# Patient Record
Sex: Female | Born: 1970 | Race: Black or African American | Hispanic: No | Smoking: Never smoker
Health system: Southern US, Community
[De-identification: ages and names within clinical notes are randomized; demographics above are authoritative.]

## PROBLEM LIST (undated history)

## (undated) DIAGNOSIS — M1990 Unspecified osteoarthritis, unspecified site: Secondary | ICD-10-CM

## (undated) DIAGNOSIS — N200 Calculus of kidney: Secondary | ICD-10-CM

## (undated) DIAGNOSIS — G500 Trigeminal neuralgia: Secondary | ICD-10-CM

## (undated) DIAGNOSIS — E0500 Thyrotoxicosis with diffuse goiter without thyrotoxic crisis or storm: Secondary | ICD-10-CM

## (undated) DIAGNOSIS — E119 Type 2 diabetes mellitus without complications: Secondary | ICD-10-CM

## (undated) DIAGNOSIS — I10 Essential (primary) hypertension: Secondary | ICD-10-CM

## (undated) HISTORY — PX: BREAST LUMPECTOMY: SHX2

## (undated) HISTORY — PX: TONSILLECTOMY: SUR1361

## (undated) HISTORY — PX: ABLATION: SHX5711

## (undated) HISTORY — PX: LITHOTRIPSY: SUR834

## (undated) HISTORY — PX: TUBAL LIGATION: SHX77

---

## 2008-03-05 ENCOUNTER — Ambulatory Visit: Payer: Self-pay | Admitting: Radiology

## 2008-03-05 ENCOUNTER — Emergency Department (HOSPITAL_BASED_OUTPATIENT_CLINIC_OR_DEPARTMENT_OTHER): Admission: EM | Admit: 2008-03-05 | Discharge: 2008-03-05 | Payer: Self-pay | Admitting: Emergency Medicine

## 2008-03-30 ENCOUNTER — Emergency Department (HOSPITAL_BASED_OUTPATIENT_CLINIC_OR_DEPARTMENT_OTHER): Admission: EM | Admit: 2008-03-30 | Discharge: 2008-03-30 | Payer: Self-pay | Admitting: Emergency Medicine

## 2008-03-31 ENCOUNTER — Emergency Department (HOSPITAL_BASED_OUTPATIENT_CLINIC_OR_DEPARTMENT_OTHER): Admission: EM | Admit: 2008-03-31 | Discharge: 2008-04-01 | Payer: Self-pay | Admitting: Emergency Medicine

## 2008-04-04 ENCOUNTER — Emergency Department (HOSPITAL_BASED_OUTPATIENT_CLINIC_OR_DEPARTMENT_OTHER): Admission: EM | Admit: 2008-04-04 | Discharge: 2008-04-04 | Payer: Self-pay | Admitting: Emergency Medicine

## 2009-02-21 ENCOUNTER — Emergency Department (HOSPITAL_BASED_OUTPATIENT_CLINIC_OR_DEPARTMENT_OTHER): Admission: EM | Admit: 2009-02-21 | Discharge: 2009-02-21 | Payer: Self-pay | Admitting: Emergency Medicine

## 2009-04-19 ENCOUNTER — Emergency Department (HOSPITAL_BASED_OUTPATIENT_CLINIC_OR_DEPARTMENT_OTHER): Admission: EM | Admit: 2009-04-19 | Discharge: 2009-04-19 | Payer: Self-pay | Admitting: Emergency Medicine

## 2009-12-10 ENCOUNTER — Emergency Department (HOSPITAL_BASED_OUTPATIENT_CLINIC_OR_DEPARTMENT_OTHER): Admission: EM | Admit: 2009-12-10 | Discharge: 2009-12-10 | Payer: Self-pay | Admitting: Emergency Medicine

## 2010-04-08 ENCOUNTER — Emergency Department (HOSPITAL_BASED_OUTPATIENT_CLINIC_OR_DEPARTMENT_OTHER)
Admission: EM | Admit: 2010-04-08 | Discharge: 2010-04-08 | Payer: Self-pay | Source: Home / Self Care | Admitting: Emergency Medicine

## 2010-06-09 LAB — URINALYSIS, ROUTINE W REFLEX MICROSCOPIC
Bilirubin Urine: NEGATIVE
Ketones, ur: NEGATIVE mg/dL
Nitrite: NEGATIVE
Protein, ur: NEGATIVE mg/dL
Urobilinogen, UA: 1 mg/dL (ref 0.0–1.0)
pH: 6 (ref 5.0–8.0)

## 2010-06-09 LAB — PREGNANCY, URINE: Preg Test, Ur: NEGATIVE

## 2010-07-22 ENCOUNTER — Emergency Department (HOSPITAL_BASED_OUTPATIENT_CLINIC_OR_DEPARTMENT_OTHER)
Admission: EM | Admit: 2010-07-22 | Discharge: 2010-07-22 | Disposition: A | Discharge status: Home / Self Care | Payer: BC Managed Care – PPO | Attending: Emergency Medicine | Admitting: Emergency Medicine

## 2010-07-22 ENCOUNTER — Emergency Department (INDEPENDENT_AMBULATORY_CARE_PROVIDER_SITE_OTHER): Payer: BC Managed Care – PPO

## 2010-07-22 DIAGNOSIS — M25579 Pain in unspecified ankle and joints of unspecified foot: Secondary | ICD-10-CM | POA: Insufficient documentation

## 2010-07-22 DIAGNOSIS — M79609 Pain in unspecified limb: Secondary | ICD-10-CM

## 2010-07-22 DIAGNOSIS — IMO0001 Reserved for inherently not codable concepts without codable children: Secondary | ICD-10-CM | POA: Insufficient documentation

## 2010-07-22 DIAGNOSIS — I10 Essential (primary) hypertension: Secondary | ICD-10-CM | POA: Insufficient documentation

## 2010-07-22 DIAGNOSIS — J45909 Unspecified asthma, uncomplicated: Secondary | ICD-10-CM | POA: Insufficient documentation

## 2011-04-05 ENCOUNTER — Encounter (HOSPITAL_BASED_OUTPATIENT_CLINIC_OR_DEPARTMENT_OTHER): Payer: Self-pay | Admitting: *Deleted

## 2011-04-05 ENCOUNTER — Emergency Department (HOSPITAL_BASED_OUTPATIENT_CLINIC_OR_DEPARTMENT_OTHER)
Admission: EM | Admit: 2011-04-05 | Discharge: 2011-04-06 | Disposition: A | Discharge status: Home / Self Care | Payer: BC Managed Care – PPO | Attending: Emergency Medicine | Admitting: Emergency Medicine

## 2011-04-05 ENCOUNTER — Emergency Department (INDEPENDENT_AMBULATORY_CARE_PROVIDER_SITE_OTHER): Payer: BC Managed Care – PPO

## 2011-04-05 DIAGNOSIS — I10 Essential (primary) hypertension: Secondary | ICD-10-CM | POA: Insufficient documentation

## 2011-04-05 DIAGNOSIS — Z09 Encounter for follow-up examination after completed treatment for conditions other than malignant neoplasm: Secondary | ICD-10-CM | POA: Insufficient documentation

## 2011-04-05 DIAGNOSIS — Z79899 Other long term (current) drug therapy: Secondary | ICD-10-CM | POA: Insufficient documentation

## 2011-04-05 DIAGNOSIS — T148XXA Other injury of unspecified body region, initial encounter: Secondary | ICD-10-CM

## 2011-04-05 DIAGNOSIS — W3400XA Accidental discharge from unspecified firearms or gun, initial encounter: Secondary | ICD-10-CM

## 2011-04-05 DIAGNOSIS — Z8739 Personal history of other diseases of the musculoskeletal system and connective tissue: Secondary | ICD-10-CM | POA: Insufficient documentation

## 2011-04-05 DIAGNOSIS — J45909 Unspecified asthma, uncomplicated: Secondary | ICD-10-CM | POA: Insufficient documentation

## 2011-04-05 DIAGNOSIS — IMO0002 Reserved for concepts with insufficient information to code with codable children: Secondary | ICD-10-CM

## 2011-04-05 HISTORY — DX: Thyrotoxicosis with diffuse goiter without thyrotoxic crisis or storm: E05.00

## 2011-04-05 HISTORY — DX: Essential (primary) hypertension: I10

## 2011-04-05 HISTORY — DX: Unspecified osteoarthritis, unspecified site: M19.90
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
## 2012-08-14 ENCOUNTER — Encounter (HOSPITAL_BASED_OUTPATIENT_CLINIC_OR_DEPARTMENT_OTHER): Payer: Self-pay

## 2012-08-14 ENCOUNTER — Emergency Department (HOSPITAL_BASED_OUTPATIENT_CLINIC_OR_DEPARTMENT_OTHER): Payer: 59

## 2012-08-14 ENCOUNTER — Emergency Department (HOSPITAL_BASED_OUTPATIENT_CLINIC_OR_DEPARTMENT_OTHER)
Admission: EM | Admit: 2012-08-14 | Discharge: 2012-08-14 | Disposition: A | Discharge status: Home / Self Care | Payer: 59 | Attending: Emergency Medicine | Admitting: Emergency Medicine

## 2012-08-14 DIAGNOSIS — Z8739 Personal history of other diseases of the musculoskeletal system and connective tissue: Secondary | ICD-10-CM | POA: Insufficient documentation

## 2012-08-14 DIAGNOSIS — Z79899 Other long term (current) drug therapy: Secondary | ICD-10-CM | POA: Insufficient documentation

## 2012-08-14 DIAGNOSIS — Z8639 Personal history of other endocrine, nutritional and metabolic disease: Secondary | ICD-10-CM | POA: Insufficient documentation

## 2012-08-14 DIAGNOSIS — M79645 Pain in left finger(s): Secondary | ICD-10-CM

## 2012-08-14 DIAGNOSIS — E119 Type 2 diabetes mellitus without complications: Secondary | ICD-10-CM | POA: Insufficient documentation

## 2012-08-14 DIAGNOSIS — Z8669 Personal history of other diseases of the nervous system and sense organs: Secondary | ICD-10-CM | POA: Insufficient documentation

## 2012-08-14 DIAGNOSIS — J45909 Unspecified asthma, uncomplicated: Secondary | ICD-10-CM | POA: Insufficient documentation

## 2012-08-14 DIAGNOSIS — Z862 Personal history of diseases of the blood and blood-forming organs and certain disorders involving the immune mechanism: Secondary | ICD-10-CM | POA: Insufficient documentation

## 2012-08-14 DIAGNOSIS — IMO0002 Reserved for concepts with insufficient information to code with codable children: Secondary | ICD-10-CM | POA: Insufficient documentation

## 2012-08-14 DIAGNOSIS — R220 Localized swelling, mass and lump, head: Secondary | ICD-10-CM | POA: Insufficient documentation

## 2012-08-14 DIAGNOSIS — R222 Localized swelling, mass and lump, trunk: Secondary | ICD-10-CM | POA: Insufficient documentation

## 2012-08-14 DIAGNOSIS — I10 Essential (primary) hypertension: Secondary | ICD-10-CM | POA: Insufficient documentation

## 2012-08-14 DIAGNOSIS — R221 Localized swelling, mass and lump, neck: Secondary | ICD-10-CM | POA: Insufficient documentation

## 2012-08-14 DIAGNOSIS — Z87442 Personal history of urinary calculi: Secondary | ICD-10-CM | POA: Insufficient documentation

## 2012-08-14 DIAGNOSIS — M79609 Pain in unspecified limb: Secondary | ICD-10-CM | POA: Insufficient documentation

## 2012-08-14 HISTORY — DX: Type 2 diabetes mellitus without complications: E11.9

## 2012-08-14 HISTORY — DX: Calculus of kidney: N20.0

## 2012-08-14 HISTORY — DX: Trigeminal neuralgia: G50.0

## 2012-08-14 NOTE — ED Notes (Signed)
C/o"burning and stinging" to left hand-started approx 7pm while sitting outside

## 2012-08-14 NOTE — ED Notes (Signed)
Pt ambulatory to bathroom without difficulty.  

## 2012-08-14 NOTE — ED Provider Notes (Signed)
History     CSN: [id number]  Arrival date & time 08/14/12  2001   First MD Initiated Contact with Patient 08/14/12 2132      Chief Complaint  Patient presents with  . Hand Pain    (Consider location/radiation/quality/duration/timing/severity/associated sxs/prior treatment) Patient is a 42 y.o. female presenting with hand pain. The history is provided by the patient.  Hand Pain This is a new problem. The current episode started today. The problem occurs constantly. The problem has been gradually worsening. Nothing aggravates the symptoms. She has tried nothing for the symptoms. The treatment provided moderate relief.  Pt reports sudden onset of stinging pain to left hand and palmar aspect of left hand.   Pt reports she has a mass growing on her neck  And she is concerned pain is from this. Pt was referred for evaluation of mass but has been unable to go due to a sick family member  Past Medical History  Diagnosis Date  . Asthma   . Arthritis   . Grave's disease   . Hypertension   . Diabetes mellitus without complication   . Kidney stone   . Trigeminal neuralgia     Past Surgical History  Procedure Laterality Date  . Tubal ligation    . Tonsillectomy    . Lithotripsy      No family history on file.  History  Substance Use Topics  . Smoking status: Never Smoker   . Smokeless tobacco: Not on file  . Alcohol Use: No    OB History   Grav Para Term Preterm Abortions TAB SAB Ect Mult Living                  Review of Systems  All other systems reviewed and are negative.    Allergies  Ultram  Home Medications   Current Outpatient Rx  Name  Route  Sig  Dispense  Refill  . lisinopril-hydrochlorothiazide (PRINZIDE,ZESTORETIC) 10-12.5 MG per tablet   Oral   Take 1 tablet by mouth daily.         Marland Kitchen METFORMIN HCL PO   Oral   Take by mouth.         Marland Kitchen albuterol (PROVENTIL HFA;VENTOLIN HFA) 108 (90 BASE) MCG/ACT inhaler   Inhalation   Inhale 2 puffs into  the lungs every 6 (six) hours as needed. For shortness of breath and wheezing         . budesonide-formoterol (SYMBICORT) 160-4.5 MCG/ACT inhaler   Inhalation   Inhale 2 puffs into the lungs 2 (two) times daily.         . chlorpheniramine (CHLOR-TRIMETON) 4 MG tablet   Oral   Take 4 mg by mouth once.         . ferrous sulfate 325 (65 FE) MG EC tablet   Oral   Take 975 mg by mouth 3 (three) times daily with meals.         Marland Kitchen ibuprofen (ADVIL,MOTRIN) 200 MG tablet   Oral   Take 400 mg by mouth every 6 (six) hours as needed. For pain         . Multiple Vitamin (MULITIVITAMIN WITH MINERALS) TABS   Oral   Take 1 tablet by mouth daily.         . vitamin B-12 (CYANOCOBALAMIN) 1000 MCG tablet   Oral   Take 1,000 mcg by mouth daily.         . vitamin C (ASCORBIC ACID) 500 MG tablet   Oral  Take 500 mg by mouth daily.           BP 155/97  Pulse 84  Temp(Src) 98.2 F (36.8 C) (Oral)  Resp 20  Ht 5\' 6"  (1.676 m)  Wt 235 lb (106.595 kg)  BMI 37.95 kg/m2  SpO2 99%  LMP 08/10/2012  Physical Exam  Constitutional: She appears well-developed and well-nourished.  HENT:  Head: Normocephalic.  Cardiovascular: Normal rate.   Pulmonary/Chest: Effort normal.  Abdominal: Soft.  Musculoskeletal: Normal range of motion.  eccymosis base of left 3rd finger,   Pt appears to have a small ruptured vein,   Left neck,  Golf ball sized swollen area,  Soft,  Feels like a lypoma  Neurological: She is alert.  Skin: Skin is warm.    ED Course  Procedures (including critical care time)  Labs Reviewed - No data to display No results found.   1. Finger pain, left   2. Mass in chest       MDM          Elson Areas, PA-C 08/14/12 2234  Lonia Skinner De Leon, PA-C 08/14/12 2234

## 2012-08-15 NOTE — ED Provider Notes (Signed)
Medical screening examination/treatment/procedure(s) were performed by non-physician practitioner and as supervising physician I was immediately available for consultation/collaboration.   Audric Venn, MD 08/15/12 1856 

## 2013-03-12 ENCOUNTER — Emergency Department (HOSPITAL_BASED_OUTPATIENT_CLINIC_OR_DEPARTMENT_OTHER)
Admission: EM | Admit: 2013-03-12 | Discharge: 2013-03-12 | Disposition: A | Discharge status: Home / Self Care | Payer: 59 | Attending: Emergency Medicine | Admitting: Emergency Medicine

## 2013-03-12 ENCOUNTER — Encounter (HOSPITAL_BASED_OUTPATIENT_CLINIC_OR_DEPARTMENT_OTHER): Payer: Self-pay | Admitting: Emergency Medicine

## 2013-03-12 DIAGNOSIS — R110 Nausea: Secondary | ICD-10-CM

## 2013-03-12 DIAGNOSIS — E119 Type 2 diabetes mellitus without complications: Secondary | ICD-10-CM | POA: Insufficient documentation

## 2013-03-12 DIAGNOSIS — M129 Arthropathy, unspecified: Secondary | ICD-10-CM | POA: Insufficient documentation

## 2013-03-12 DIAGNOSIS — J45909 Unspecified asthma, uncomplicated: Secondary | ICD-10-CM | POA: Insufficient documentation

## 2013-03-12 DIAGNOSIS — I10 Essential (primary) hypertension: Secondary | ICD-10-CM | POA: Insufficient documentation

## 2013-03-12 DIAGNOSIS — Z3202 Encounter for pregnancy test, result negative: Secondary | ICD-10-CM | POA: Insufficient documentation

## 2013-03-12 DIAGNOSIS — R5383 Other fatigue: Secondary | ICD-10-CM | POA: Insufficient documentation

## 2013-03-12 DIAGNOSIS — R5381 Other malaise: Secondary | ICD-10-CM | POA: Insufficient documentation

## 2013-03-12 DIAGNOSIS — Z8669 Personal history of other diseases of the nervous system and sense organs: Secondary | ICD-10-CM | POA: Insufficient documentation

## 2013-03-12 DIAGNOSIS — Z87442 Personal history of urinary calculi: Secondary | ICD-10-CM | POA: Insufficient documentation

## 2013-03-12 DIAGNOSIS — Z79899 Other long term (current) drug therapy: Secondary | ICD-10-CM | POA: Insufficient documentation

## 2013-03-12 LAB — CBC WITH DIFFERENTIAL/PLATELET
Basophils Relative: 1 % (ref 0–1)
Eosinophils Absolute: 0.1 10*3/uL (ref 0.0–0.7)
Hemoglobin: 12 g/dL (ref 12.0–15.0)
Lymphs Abs: 3.3 10*3/uL (ref 0.7–4.0)
MCH: 27.3 pg (ref 26.0–34.0)
MCHC: 33.9 g/dL (ref 30.0–36.0)
Monocytes Relative: 6 % (ref 3–12)
Neutro Abs: 4.3 10*3/uL (ref 1.7–7.7)
Neutrophils Relative %: 53 % (ref 43–77)
Platelets: 274 10*3/uL (ref 150–400)
RBC: 4.39 MIL/uL (ref 3.87–5.11)

## 2013-03-12 LAB — COMPREHENSIVE METABOLIC PANEL
AST: 48 U/L — ABNORMAL HIGH (ref 0–37)
CO2: 25 mEq/L (ref 19–32)
Calcium: 9.7 mg/dL (ref 8.4–10.5)
Chloride: 101 mEq/L (ref 96–112)
Creatinine, Ser: 0.7 mg/dL (ref 0.50–1.10)
GFR calc Af Amer: >90 mL/min (ref >90)
GFR calc non Af Amer: >90 mL/min (ref >90)
Glucose, Bld: 163 mg/dL — ABNORMAL HIGH (ref 70–99)
Total Bilirubin: 0.5 mg/dL (ref 0.3–1.2)

## 2013-03-12 LAB — LIPASE, BLOOD: Lipase: 50 U/L (ref 11–59)

## 2013-03-12 LAB — URINALYSIS, ROUTINE W REFLEX MICROSCOPIC
Bilirubin Urine: NEGATIVE
Glucose, UA: NEGATIVE mg/dL
Ketones, ur: 15 mg/dL — AB
Protein, ur: NEGATIVE mg/dL
Urobilinogen, UA: 1 mg/dL (ref 0.0–1.0)

## 2013-03-12 LAB — URINE MICROSCOPIC-ADD ON

## 2013-03-12 MED ORDER — ONDANSETRON HCL 4 MG PO TABS: 4.0000 mg | ORAL_TABLET | Freq: Three times a day (TID) | ORAL | Status: DC | PRN

## 2013-03-12 MED ORDER — ONDANSETRON HCL 8 MG PO TABS
4.0000 mg | ORAL_TABLET | Freq: Once | ORAL | Status: AC
  Administered 2013-03-12: 4 mg via ORAL
  Filled 2013-03-12: qty 1

## 2013-03-12 NOTE — ED Notes (Addendum)
C/o ha onset yesterday  Nausea onset this am, vomiting x 1  Denies diarrhea,   Hot/  Cold flashes   Generalized body aches

## 2013-03-12 NOTE — ED Notes (Signed)
Pt sipping ginger ale.

## 2013-03-12 NOTE — ED Notes (Signed)
C/o right sided abdominal yesterday, onset nausea today.  One episode of vomiting.  Recently (02/25/13)started a new DM medication (Glyburide).  Denies diarrhea, fever.

## 2013-03-12 NOTE — ED Provider Notes (Signed)
Medical screening examination/treatment/procedure(s) were performed by non-physician practitioner and as supervising physician I was immediately available for consultation/collaboration.     Geoffery Lyons, MD 03/12/13 [id number]

## 2013-03-12 NOTE — ED Provider Notes (Signed)
CSN: [id number]     Arrival date & time 03/12/13  1927 History   First MD Initiated Contact with Patient 03/12/13 1948     Chief Complaint  Patient presents with  . Abdominal Pain   (Consider location/radiation/quality/duration/timing/severity/associated sxs/prior Treatment) Patient is a 42 y.o. female presenting with weakness. The history is provided by the patient. No language interpreter was used.  Weakness The problem has been gradually worsening. Associated symptoms include nausea and weakness. Pertinent negatives include no abdominal pain, chills, fever or vomiting. Associated symptoms comments: She reports nausea with vomiting and generalized aches that started yesterday. No fever. She denies congestion, sore throat, cough, pain or diarrhea. "I just feel sick". .    Past Medical History  Diagnosis Date  . Asthma   . Arthritis   . Grave's disease   . Hypertension   . Diabetes mellitus without complication   . Kidney stone   . Trigeminal neuralgia    Past Surgical History  Procedure Laterality Date  . Tubal ligation    . Tonsillectomy    . Lithotripsy     No family history on file. History  Substance Use Topics  . Smoking status: Never Smoker   . Smokeless tobacco: Not on file  . Alcohol Use: No   OB History   Grav Para Term Preterm Abortions TAB SAB Ect Mult Living                 Review of Systems  Constitutional: Negative for fever and chills.  HENT: Negative.   Respiratory: Negative.   Cardiovascular: Negative.   Gastrointestinal: Positive for nausea. Negative for vomiting, abdominal pain and diarrhea.  Genitourinary: Negative.  Negative for dysuria.  Musculoskeletal: Negative.   Skin: Negative.   Neurological: Positive for weakness.    Allergies  Ultram  Home Medications   Current Outpatient Rx  Name  Route  Sig  Dispense  Refill  . cholecalciferol (VITAMIN D) 1000 UNITS tablet   Oral   Take 2,000 Units by mouth daily.         Marland Kitchen glyBURIDE  (DIABETA) 1.25 MG tablet   Oral   Take 1.25 mg by mouth daily with breakfast.         . albuterol (PROVENTIL HFA;VENTOLIN HFA) 108 (90 BASE) MCG/ACT inhaler   Inhalation   Inhale 2 puffs into the lungs every 6 (six) hours as needed. For shortness of breath and wheezing         . budesonide-formoterol (SYMBICORT) 160-4.5 MCG/ACT inhaler   Inhalation   Inhale 2 puffs into the lungs 2 (two) times daily.         . chlorpheniramine (CHLOR-TRIMETON) 4 MG tablet   Oral   Take 4 mg by mouth once.         . ferrous sulfate 325 (65 FE) MG EC tablet   Oral   Take 975 mg by mouth 3 (three) times daily with meals.         Marland Kitchen ibuprofen (ADVIL,MOTRIN) 200 MG tablet   Oral   Take 400 mg by mouth every 6 (six) hours as needed. For pain         . lisinopril-hydrochlorothiazide (PRINZIDE,ZESTORETIC) 10-12.5 MG per tablet   Oral   Take 1 tablet by mouth daily.         Marland Kitchen METFORMIN HCL PO   Oral   Take by mouth.         . Multiple Vitamin (MULITIVITAMIN WITH MINERALS) TABS   Oral  Take 1 tablet by mouth daily.         . vitamin B-12 (CYANOCOBALAMIN) 1000 MCG tablet   Oral   Take 1,000 mcg by mouth daily.         . vitamin C (ASCORBIC ACID) 500 MG tablet   Oral   Take 500 mg by mouth daily.          BP 149/90  Pulse 88  Temp(Src) 98.2 F (36.8 C) (Oral)  Resp 20  Ht 5\' 5"  (1.651 m)  Wt 238 lb (107.956 kg)  BMI 39.61 kg/m2  SpO2 100%  LMP 03/11/2013 Physical Exam  Constitutional: She is oriented to person, place, and time. She appears well-developed and well-nourished.  HENT:  Head: Normocephalic.  Neck: Normal range of motion. Neck supple.  Cardiovascular: Normal rate and regular rhythm.   Pulmonary/Chest: Effort normal and breath sounds normal.  Abdominal: Soft. Bowel sounds are normal. There is no tenderness. There is no rebound and no guarding.  Musculoskeletal: Normal range of motion.  Neurological: She is alert and oriented to person, place, and  time.  Skin: Skin is warm and dry. No rash noted.  Psychiatric: She has a normal mood and affect.    ED Course  Procedures (including critical care time) Labs Review Labs Reviewed  URINALYSIS, ROUTINE W REFLEX MICROSCOPIC - Abnormal; Notable for the following:    Color, Urine AMBER (*)    APPearance CLOUDY (*)    Hgb urine dipstick LARGE (*)    Ketones, ur 15 (*)    Leukocytes, UA SMALL (*)    All other components within normal limits  COMPREHENSIVE METABOLIC PANEL - Abnormal; Notable for the following:    Glucose, Bld 163 (*)    AST 48 (*)    ALT 56 (*)    All other components within normal limits  CBC WITH DIFFERENTIAL - Abnormal; Notable for the following:    HCT 35.4 (*)    All other components within normal limits  URINE MICROSCOPIC-ADD ON - Abnormal; Notable for the following:    Squamous Epithelial / LPF FEW (*)    All other components within normal limits  PREGNANCY, URINE  LIPASE, BLOOD   Imaging Review No results found.  EKG Interpretation   None       MDM  No diagnosis found. 1. Nausea without vomiting 2. Malaise  She appears well, non-toxic. Tolerating PO fluids in ED. No vomiting, no fever. Stable for discharge with likely viral process.     Arnoldo Hooker, PA-C 03/12/13 2147

## 2013-03-12 NOTE — ED Notes (Signed)
Attempted to collect a UA, unable to void.

## 2013-04-08 ENCOUNTER — Encounter (HOSPITAL_BASED_OUTPATIENT_CLINIC_OR_DEPARTMENT_OTHER): Payer: Self-pay | Admitting: Emergency Medicine

## 2013-04-08 ENCOUNTER — Emergency Department (HOSPITAL_BASED_OUTPATIENT_CLINIC_OR_DEPARTMENT_OTHER): Payer: 59

## 2013-04-08 ENCOUNTER — Emergency Department (HOSPITAL_BASED_OUTPATIENT_CLINIC_OR_DEPARTMENT_OTHER)
Admission: EM | Admit: 2013-04-08 | Discharge: 2013-04-08 | Disposition: A | Discharge status: Home / Self Care | Payer: 59 | Attending: Emergency Medicine | Admitting: Emergency Medicine

## 2013-04-08 DIAGNOSIS — R6883 Chills (without fever): Secondary | ICD-10-CM | POA: Insufficient documentation

## 2013-04-08 DIAGNOSIS — M129 Arthropathy, unspecified: Secondary | ICD-10-CM | POA: Insufficient documentation

## 2013-04-08 DIAGNOSIS — E119 Type 2 diabetes mellitus without complications: Secondary | ICD-10-CM | POA: Insufficient documentation

## 2013-04-08 DIAGNOSIS — J45909 Unspecified asthma, uncomplicated: Secondary | ICD-10-CM | POA: Insufficient documentation

## 2013-04-08 DIAGNOSIS — R110 Nausea: Secondary | ICD-10-CM | POA: Insufficient documentation

## 2013-04-08 DIAGNOSIS — R5383 Other fatigue: Secondary | ICD-10-CM | POA: Insufficient documentation

## 2013-04-08 DIAGNOSIS — I10 Essential (primary) hypertension: Secondary | ICD-10-CM | POA: Insufficient documentation

## 2013-04-08 DIAGNOSIS — R5381 Other malaise: Secondary | ICD-10-CM | POA: Insufficient documentation

## 2013-04-08 DIAGNOSIS — B9789 Other viral agents as the cause of diseases classified elsewhere: Secondary | ICD-10-CM | POA: Insufficient documentation

## 2013-04-08 DIAGNOSIS — R1011 Right upper quadrant pain: Secondary | ICD-10-CM | POA: Insufficient documentation

## 2013-04-08 DIAGNOSIS — R197 Diarrhea, unspecified: Secondary | ICD-10-CM | POA: Insufficient documentation

## 2013-04-08 DIAGNOSIS — Z79899 Other long term (current) drug therapy: Secondary | ICD-10-CM | POA: Insufficient documentation

## 2013-04-08 DIAGNOSIS — J3489 Other specified disorders of nose and nasal sinuses: Secondary | ICD-10-CM | POA: Insufficient documentation

## 2013-04-08 DIAGNOSIS — Z8669 Personal history of other diseases of the nervous system and sense organs: Secondary | ICD-10-CM | POA: Insufficient documentation

## 2013-04-08 DIAGNOSIS — B349 Viral infection, unspecified: Secondary | ICD-10-CM

## 2013-04-08 DIAGNOSIS — Z87442 Personal history of urinary calculi: Secondary | ICD-10-CM | POA: Insufficient documentation

## 2013-04-08 LAB — COMPREHENSIVE METABOLIC PANEL
ALBUMIN: 4 g/dL (ref 3.5–5.2)
ALT: 61 U/L — AB (ref 0–35)
AST: 67 U/L — AB (ref 0–37)
Alkaline Phosphatase: 51 U/L (ref 39–117)
BUN: 9 mg/dL (ref 6–23)
CALCIUM: 9.6 mg/dL (ref 8.4–10.5)
CO2: 25 mEq/L (ref 19–32)
Chloride: 103 mEq/L (ref 96–112)
Creatinine, Ser: 0.7 mg/dL (ref 0.50–1.10)
GFR calc Af Amer: >90 mL/min (ref >90)
Glucose, Bld: 101 mg/dL — ABNORMAL HIGH (ref 70–99)
Potassium: 4.2 mEq/L (ref 3.7–5.3)
SODIUM: 140 meq/L (ref 137–147)
TOTAL PROTEIN: 7.6 g/dL (ref 6.0–8.3)
Total Bilirubin: 0.8 mg/dL (ref 0.3–1.2)

## 2013-04-08 LAB — CBC WITH DIFFERENTIAL/PLATELET
Basophils Absolute: 0 10*3/uL (ref 0.0–0.1)
Basophils Relative: 1 % (ref 0–1)
Eosinophils Absolute: 0.1 10*3/uL (ref 0.0–0.7)
Eosinophils Relative: 1 % (ref 0–5)
HCT: 37.1 % (ref 36.0–46.0)
HEMOGLOBIN: 12.2 g/dL (ref 12.0–15.0)
LYMPHS ABS: 2.6 10*3/uL (ref 0.7–4.0)
LYMPHS PCT: 40 % (ref 12–46)
MCH: 26.7 pg (ref 26.0–34.0)
MCHC: 32.9 g/dL (ref 30.0–36.0)
MCV: 81.2 fL (ref 78.0–100.0)
MONOS PCT: 7 % (ref 3–12)
Monocytes Absolute: 0.4 10*3/uL (ref 0.1–1.0)
NEUTROS ABS: 3.3 10*3/uL (ref 1.7–7.7)
NEUTROS PCT: 51 % (ref 43–77)
Platelets: 259 10*3/uL (ref 150–400)
RBC: 4.57 MIL/uL (ref 3.87–5.11)
RDW: 15.8 % — ABNORMAL HIGH (ref 11.5–15.5)
WBC: 6.5 10*3/uL (ref 4.0–10.5)

## 2013-04-08 LAB — URINALYSIS, ROUTINE W REFLEX MICROSCOPIC
Bilirubin Urine: NEGATIVE
GLUCOSE, UA: NEGATIVE mg/dL
HGB URINE DIPSTICK: NEGATIVE
Ketones, ur: NEGATIVE mg/dL
Leukocytes, UA: NEGATIVE
Nitrite: NEGATIVE
PH: 5.5 (ref 5.0–8.0)
Protein, ur: NEGATIVE mg/dL
SPECIFIC GRAVITY, URINE: 1.027 (ref 1.005–1.030)
Urobilinogen, UA: 1 mg/dL (ref 0.0–1.0)

## 2013-04-08 LAB — LIPASE, BLOOD: Lipase: 37 U/L (ref 11–59)

## 2013-04-08 MED ORDER — ONDANSETRON HCL 4 MG/2ML IJ SOLN
4.0000 mg | Freq: Once | INTRAMUSCULAR | Status: AC
  Administered 2013-04-08: 4 mg via INTRAVENOUS
  Filled 2013-04-08: qty 2

## 2013-04-08 MED ORDER — MORPHINE SULFATE 4 MG/ML IJ SOLN
4.0000 mg | Freq: Once | INTRAMUSCULAR | Status: AC
  Administered 2013-04-08: 4 mg via INTRAVENOUS
  Filled 2013-04-08: qty 1

## 2013-04-08 MED ORDER — ONDANSETRON HCL 4 MG PO TABS: 4.0000 mg | ORAL_TABLET | Freq: Four times a day (QID) | ORAL | Status: DC

## 2013-04-08 MED ORDER — SODIUM CHLORIDE 0.9 % IV BOLUS (SEPSIS)
1000.0000 mL | Freq: Once | INTRAVENOUS | Status: AC
  Administered 2013-04-08: 1000 mL via INTRAVENOUS

## 2013-04-08 NOTE — ED Notes (Signed)
Cough congestion chills and body aches coughing  Until she "gags"

## 2013-04-08 NOTE — ED Provider Notes (Signed)
CSN: [id number]     Arrival date & time 04/08/13  1031 History   First MD Initiated Contact with Patient 04/08/13 1041     Chief Complaint  Patient presents with  . body aches chills cough    (Consider location/radiation/quality/duration/timing/severity/associated sxs/prior Treatment) Patient is a 43 y.o. female presenting with URI and diarrhea. The history is provided by the patient.  URI Presenting symptoms: congestion, cough, fatigue and rhinorrhea   Severity:  Moderate Onset quality:  Gradual Duration:  6 days Timing:  Constant Progression:  Unchanged Chronicity:  New Relieved by:  Nothing Worsened by:  Nothing tried Ineffective treatments:  None tried Associated symptoms: no myalgias and no wheezing   Risk factors: diabetes mellitus and sick contacts   Risk factors: no chronic cardiac disease, no chronic kidney disease and no chronic respiratory disease   Diarrhea Quality:  Watery Severity:  Moderate Onset quality:  Gradual Number of episodes:  >10 for the last few days but no diarrhea today Timing:  Intermittent Progression:  Resolved Relieved by:  None tried Worsened by:  Nothing tried Ineffective treatments:  None tried Associated symptoms: abdominal pain, cough and URI   Associated symptoms: no myalgias and no vomiting   Risk factors: sick contacts   Risk factors: no recent antibiotic use, no suspicious food intake and no travel to endemic areas     Past Medical History  Diagnosis Date  . Asthma   . Arthritis   . Grave's disease   . Hypertension   . Diabetes mellitus without complication   . Kidney stone   . Trigeminal neuralgia    Past Surgical History  Procedure Laterality Date  . Tubal ligation    . Tonsillectomy    . Lithotripsy     History reviewed. No pertinent family history. History  Substance Use Topics  . Smoking status: Never Smoker   . Smokeless tobacco: Not on file  . Alcohol Use: No   OB History   Grav Para Term Preterm Abortions  TAB SAB Ect Mult Living                 Review of Systems  Constitutional: Positive for fatigue.  HENT: Positive for congestion and rhinorrhea.   Respiratory: Positive for cough. Negative for wheezing.   Gastrointestinal: Positive for abdominal pain and diarrhea. Negative for vomiting.  Musculoskeletal: Negative for myalgias.  All other systems reviewed and are negative.    Allergies  Ultram  Home Medications   Current Outpatient Rx  Name  Route  Sig  Dispense  Refill  . albuterol (PROVENTIL HFA;VENTOLIN HFA) 108 (90 BASE) MCG/ACT inhaler   Inhalation   Inhale 2 puffs into the lungs every 6 (six) hours as needed. For shortness of breath and wheezing         . budesonide-formoterol (SYMBICORT) 160-4.5 MCG/ACT inhaler   Inhalation   Inhale 2 puffs into the lungs 2 (two) times daily.         . chlorpheniramine (CHLOR-TRIMETON) 4 MG tablet   Oral   Take 4 mg by mouth once.         . cholecalciferol (VITAMIN D) 1000 UNITS tablet   Oral   Take 2,000 Units by mouth daily.         . ferrous sulfate 325 (65 FE) MG EC tablet   Oral   Take 975 mg by mouth 3 (three) times daily with meals.         Marland Kitchen glyBURIDE (DIABETA) 1.25 MG tablet  Oral   Take 1.25 mg by mouth daily with breakfast.         . ibuprofen (ADVIL,MOTRIN) 200 MG tablet   Oral   Take 400 mg by mouth every 6 (six) hours as needed. For pain         . lisinopril-hydrochlorothiazide (PRINZIDE,ZESTORETIC) 10-12.5 MG per tablet   Oral   Take 1 tablet by mouth daily.         Marland Kitchen METFORMIN HCL PO   Oral   Take by mouth.         . Multiple Vitamin (MULITIVITAMIN WITH MINERALS) TABS   Oral   Take 1 tablet by mouth daily.         . ondansetron (ZOFRAN) 4 MG tablet   Oral   Take 1 tablet (4 mg total) by mouth every 8 (eight) hours as needed for nausea or vomiting.   8 tablet   0   . ondansetron (ZOFRAN) 4 MG tablet   Oral   Take 1 tablet (4 mg total) by mouth every 6 (six) hours.   12  tablet   0   . vitamin B-12 (CYANOCOBALAMIN) 1000 MCG tablet   Oral   Take 1,000 mcg by mouth daily.         . vitamin C (ASCORBIC ACID) 500 MG tablet   Oral   Take 500 mg by mouth daily.          BP 138/94  Pulse 83  Temp(Src) 98.3 F (36.8 C) (Oral)  Resp 16  Ht 5\' 5"  (1.651 m)  Wt 230 lb (104.327 kg)  BMI 38.27 kg/m2  SpO2 100%  LMP 03/11/2013 Physical Exam  Nursing note and vitals reviewed. Constitutional: She is oriented to person, place, and time. She appears well-developed and well-nourished. No distress.  HENT:  Head: Normocephalic and atraumatic.  Right Ear: Tympanic membrane and ear canal normal.  Left Ear: Tympanic membrane and ear canal normal.  Nose: Mucosal edema and rhinorrhea present.  Mouth/Throat: Posterior oropharyngeal erythema present. No oropharyngeal exudate or posterior oropharyngeal edema.  Eyes: Conjunctivae and EOM are normal. Pupils are equal, round, and reactive to light.  Neck: Normal range of motion. Neck supple.  Cardiovascular: Normal rate, regular rhythm and intact distal pulses.   No murmur heard. Pulmonary/Chest: Effort normal and breath sounds normal. No respiratory distress. She has no wheezes. She has no rales.  Abdominal: Soft. She exhibits no distension. There is tenderness in the right upper quadrant. There is no rebound and no guarding.  Musculoskeletal: Normal range of motion. She exhibits no edema and no tenderness.  Neurological: She is alert and oriented to person, place, and time.  Skin: Skin is warm and dry. No rash noted. No erythema.  Psychiatric: She has a normal mood and affect. Her behavior is normal.    ED Course  Procedures (including critical care time) Labs Review Labs Reviewed  CBC WITH DIFFERENTIAL - Abnormal; Notable for the following:    RDW 15.8 (*)    All other components within normal limits  COMPREHENSIVE METABOLIC PANEL - Abnormal; Notable for the following:    Glucose, Bld 101 (*)    AST 67 (*)     ALT 61 (*)    All other components within normal limits  URINALYSIS, ROUTINE W REFLEX MICROSCOPIC - Abnormal; Notable for the following:    Color, Urine AMBER (*)    APPearance CLOUDY (*)    All other components within normal limits  LIPASE, BLOOD  Imaging Review Dg Chest 2 View  04/08/2013   CLINICAL DATA:  Cough and fever  EXAM: CHEST  2 VIEW  COMPARISON:  10/28/2012  FINDINGS: The heart size and mediastinal contours are within normal limits. Both lungs are clear. The visualized skeletal structures are unremarkable.  IMPRESSION: No active cardiopulmonary disease.   Electronically Signed   By: Franchot Gallo M.D.   On: 04/08/2013 11:39   US Abdomen Complete  04/08/2013   CLINICAL DATA:  Nausea and vomiting.  EXAM: ULTRASOUND ABDOMEN COMPLETE  COMPARISON:  CT, 05/04/2011  FINDINGS: Gallbladder:  No gallstones or wall thickening visualized. No sonographic Murphy sign noted.  Common bile duct:  Diameter: 3.5 mm  Liver:  Liver is echogenic with poor through transmission of the sound beam. Liver is normal in size. No liver mass or focal lesion is seen.  IVC:  Limited visualization but grossly unremarkable.  Pancreas:  Minimally visualized.  Portions seen are unremarkable.  Spleen:  Size and appearance within normal limits.  Right Kidney:  Length: 12.7 cm. Echogenicity within normal limits. No mass or hydronephrosis visualized.  Left Kidney:  Length: 12.2 cm. Echogenicity within normal limits. No mass or hydronephrosis visualized.  Abdominal aorta:  Limited visualization.  No aneurysm is seen.  Other findings:  None.  IMPRESSION: 1. No acute findings. 2. Hepatic steatosis. 3. Midline structures not well evaluated. 4. Otherwise unremarkable.   Electronically Signed   By: Lajean Manes M.D.   On: 04/08/2013 12:44    EKG Interpretation   None       MDM   1. Viral syndrome    Patient here with URI symptoms as well as diarrhea and nausea. Symptoms started approximately 6 days ago with congestion  and body aches and then progressed to diarrhea multiple times a day for the last 4 days but no episodes today. She denies recent travel or antibiotic usage. Currently she is complaining of nausea right upper quadrant pain and coughing congestion. She is currently afebrile and vital signs are within normal limits. On exam she does have right upper cord or tenderness and given the vomiting and diarrhea rule out pneumonia versus gallbladder pathology. CBC is within normal limits, UA is unremarkable her lipase was normal. CMP with mild elevation of liver enzymes which are relatively unchanged from prior. Chest x-ray negative for pneumonia and upper abdominal ultrasound showed a normal gallbladder but hepatic steatosis. Patient felt much better after fluids and Zofran. Feel that her cough congestion vomiting and diarrhea viral in nature. She will continue oral fluids and she was given a prescription for nausea medication. She will followup with her doctor to follow her liver enzymes. She was discharged home.    Blanchie Dessert, MD 04/08/13 1447

## 2013-08-08 ENCOUNTER — Encounter (HOSPITAL_COMMUNITY): Payer: Self-pay | Admitting: *Deleted

## 2013-08-08 ENCOUNTER — Inpatient Hospital Stay (HOSPITAL_COMMUNITY): Payer: 59

## 2013-08-08 ENCOUNTER — Inpatient Hospital Stay (HOSPITAL_COMMUNITY)
Admission: AD | Admit: 2013-08-08 | Discharge: 2013-08-09 | Disposition: A | Discharge status: Home / Self Care | Payer: 59 | Source: Ambulatory Visit | Attending: Obstetrics and Gynecology | Admitting: Obstetrics and Gynecology

## 2013-08-08 DIAGNOSIS — I10 Essential (primary) hypertension: Secondary | ICD-10-CM

## 2013-08-08 DIAGNOSIS — N946 Dysmenorrhea, unspecified: Secondary | ICD-10-CM | POA: Insufficient documentation

## 2013-08-08 DIAGNOSIS — R102 Pelvic and perineal pain: Secondary | ICD-10-CM

## 2013-08-08 DIAGNOSIS — E119 Type 2 diabetes mellitus without complications: Secondary | ICD-10-CM

## 2013-08-08 DIAGNOSIS — R9389 Abnormal findings on diagnostic imaging of other specified body structures: Secondary | ICD-10-CM | POA: Insufficient documentation

## 2013-08-08 DIAGNOSIS — E0500 Thyrotoxicosis with diffuse goiter without thyrotoxic crisis or storm: Secondary | ICD-10-CM | POA: Insufficient documentation

## 2013-08-08 DIAGNOSIS — D259 Leiomyoma of uterus, unspecified: Secondary | ICD-10-CM | POA: Insufficient documentation

## 2013-08-08 LAB — CBC WITH DIFFERENTIAL/PLATELET
Basophils Absolute: 0.1 10*3/uL (ref 0.0–0.1)
Basophils Relative: 1 % (ref 0–1)
Eosinophils Absolute: 0.1 10*3/uL (ref 0.0–0.7)
Eosinophils Relative: 1 % (ref 0–5)
HEMATOCRIT: 35.1 % — AB (ref 36.0–46.0)
HEMOGLOBIN: 11.9 g/dL — AB (ref 12.0–15.0)
LYMPHS PCT: 41 % (ref 12–46)
Lymphs Abs: 3.8 10*3/uL (ref 0.7–4.0)
MCH: 27.2 pg (ref 26.0–34.0)
MCHC: 33.9 g/dL (ref 30.0–36.0)
MCV: 80.1 fL (ref 78.0–100.0)
MONO ABS: 0.7 10*3/uL (ref 0.1–1.0)
MONOS PCT: 8 % (ref 3–12)
Neutro Abs: 4.5 10*3/uL (ref 1.7–7.7)
Neutrophils Relative %: 50 % (ref 43–77)
Platelets: 290 10*3/uL (ref 150–400)
RBC: 4.38 MIL/uL (ref 3.87–5.11)
RDW: 15.1 % (ref 11.5–15.5)
WBC: 9.1 10*3/uL (ref 4.0–10.5)

## 2013-08-08 LAB — URINALYSIS, ROUTINE W REFLEX MICROSCOPIC
GLUCOSE, UA: NEGATIVE mg/dL
Ketones, ur: 15 mg/dL — AB
LEUKOCYTES UA: NEGATIVE
Nitrite: NEGATIVE
PH: 5.5 (ref 5.0–8.0)
Protein, ur: 30 mg/dL — AB
Urobilinogen, UA: 0.2 mg/dL (ref 0.0–1.0)

## 2013-08-08 LAB — URINE MICROSCOPIC-ADD ON: WBC, UA: NONE SEEN WBC/hpf (ref <3)

## 2013-08-08 LAB — POCT PREGNANCY, URINE: Preg Test, Ur: NEGATIVE

## 2013-08-08 MED ORDER — HYDROCODONE-ACETAMINOPHEN 5-325 MG PO TABS: 1.0000 | ORAL_TABLET | Freq: Four times a day (QID) | ORAL | Status: DC | PRN

## 2013-08-08 MED ORDER — KETOROLAC TROMETHAMINE 60 MG/2ML IM SOLN
60.0000 mg | Freq: Once | INTRAMUSCULAR | Status: AC
  Administered 2013-08-08: 60 mg via INTRAMUSCULAR
  Filled 2013-08-08: qty 2

## 2013-08-08 NOTE — MAU Note (Signed)
Started menstrual cycle yesterday.  Having severe cramping today.  Nausea without vomiting.  Hx of BTL and 2 ablations.

## 2013-08-08 NOTE — Discharge Instructions (Signed)
Dysmenorrhea °Menstrual cramps (dysmenorrhea) are caused by the muscles of the uterus tightening (contracting) during a menstrual period. For some women, this discomfort is merely bothersome. For others, dysmenorrhea can be severe enough to interfere with everyday activities for a few days each month. °Primary dysmenorrhea is menstrual cramps that last a couple of days when you start having menstrual periods or soon after. This often begins after a teenager starts having her period. As a woman gets older or has a baby, the cramps will usually lessen or disappear. Secondary dysmenorrhea begins later in life, lasts longer, and the pain may be stronger than primary dysmenorrhea. The pain may start before the period and last a few days after the period.  °CAUSES  °Dysmenorrhea is usually caused by an underlying problem, such as: °· The tissue lining the uterus grows outside of the uterus in other areas of the body (endometriosis). °· The endometrial tissue, which normally lines the uterus, is found in or grows into the muscular walls of the uterus (adenomyosis). °· The pelvic blood vessels are engorged with blood just before the menstrual period (pelvic congestive syndrome). °· Overgrowth of cells (polyps) in the lining of the uterus or cervix. °· Falling down of the uterus (prolapse) because of loose or stretched ligaments. °· Depression. °· Bladder problems, infection, or inflammation. °· Problems with the intestine, a tumor, or irritable bowel syndrome. °· Cancer of the female organs or bladder. °· A severely tipped uterus. °· A very tight opening or closed cervix. °· Noncancerous tumors of the uterus (fibroids). °· Pelvic inflammatory disease (PID). °· Pelvic scarring (adhesions) from a previous surgery. °· Ovarian cyst. °· An intrauterine device (IUD) used for birth control. °RISK FACTORS °You may be at greater risk of dysmenorrhea if: °· You are younger than age 30. °· You started puberty early. °· You have  irregular or heavy bleeding. °· You have never given birth. °· You have a family history of this problem. °· You are a smoker. °SIGNS AND SYMPTOMS  °· Cramping or throbbing pain in your lower abdomen. °· Headaches. °· Lower back pain. °· Nausea or vomiting. °· Diarrhea. °· Sweating or dizziness. °· Loose stools. °DIAGNOSIS  °A diagnosis is based on your history, symptoms, physical exam, diagnostic tests, or procedures. Diagnostic tests or procedures may include: °· Blood tests. °· Ultrasonography. °· An examination of the lining of the uterus (dilation and curettage, D&C). °· An examination inside your abdomen or pelvis with a scope (laparoscopy). °· X-rays. °· CT scan. °· MRI. °· An examination inside the bladder with a scope (cystoscopy). °· An examination inside the intestine or stomach with a scope (colonoscopy, gastroscopy). °TREATMENT  °Treatment depends on the cause of the dysmenorrhea. Treatment may include: °· Pain medicine prescribed by your health care provider. °· Birth control pills or an IUD with progesterone hormone in it. °· Hormone replacement therapy. °· Nonsteroidal anti-inflammatory drugs (NSAIDs). These may help stop the production of prostaglandins. °· Surgery to remove adhesions, endometriosis, ovarian cyst, or fibroids. °· Removal of the uterus (hysterectomy). °· Progesterone shots to stop the menstrual period. °· Cutting the nerves on the sacrum that go to the female organs (presacral neurectomy). °· Electric current to the sacral nerves (sacral nerve stimulation). °· Antidepressant medicine. °· Psychiatric therapy, counseling, or group therapy. °· Exercise and physical therapy. °· Meditation and yoga therapy. °· Acupuncture. °HOME CARE INSTRUCTIONS  °· Only take over-the-counter or prescription medicines as directed by your health care provider. °· Place a heating pad   or hot water bottle on your lower back or abdomen. Do not sleep with the heating pad.  Use aerobic exercises, walking,  swimming, biking, and other exercises to help lessen the cramping.  Massage to the lower back or abdomen may help.  Stop smoking.  Avoid alcohol and caffeine. SEEK MEDICAL CARE IF:   Your pain does not get better with medicine.  You have pain with sexual intercourse.  Your pain increases and is not controlled with medicines.  You have abnormal vaginal bleeding with your period.  You develop nausea or vomiting with your period that is not controlled with medicine. SEEK IMMEDIATE MEDICAL CARE IF:  You pass out.  Document Released: 03/13/2005 Document Revised: 11/13/2012 Document Reviewed: 08/29/2012 [facility] Patient Information 2014 [place].  Fibroids Fibroids are lumps (tumors) that can occur any place in a woman's body. These lumps are not cancerous. Fibroids vary in size, weight, and where they grow. HOME CARE  Do not take aspirin.  Write down the number of pads or tampons you use during your period. Tell your doctor. This can help determine the best treatment for you. GET HELP RIGHT AWAY IF:  You have pain in your lower belly (abdomen) that is not helped with medicine.  You have cramps that are not helped with medicine.  You have more bleeding between or during your period.  You feel lightheaded or pass out (faint).  Your lower belly pain gets worse. MAKE SURE YOU:  Understand these instructions.  Will watch your condition.  Will get help right away if you are not doing well or get worse. Document Released: 04/15/2010 Document Revised: 06/05/2011 Document Reviewed: 04/15/2010 [facility] Patient Information 2014 [place], [place]. Prenatal Care Providers [facility]  [phone]     Phone: [place]                      [facility]  [email or web address]     Phone: [phone]  Phone: [place]                  Phone: [phone]  Phone: [facility] [place]                hone: [phone]  Phone: [phone]         [facility] [address] Racer      Phone: [phone]  Phone: [phone]         [place] [facility]                Phone: [phone]  Ione Cromwell)          Phone: [phone] [facility]   Phone: [phone]

## 2013-08-08 NOTE — MAU Provider Note (Signed)
History     CSN: [id number]  Arrival date and time: 08/08/13 2042   None     Chief Complaint  Patient presents with  . Dysmenorrhea   HPI This is a 43 y.o. female who presents with c/o severe dysmenorrhea after starting her period yesterday. Has also had some nausea. Has had a BTL and two ablations.   RN Note:  Started menstrual cycle yesterday. Having severe cramping today. Nausea without vomiting. Hx of BTL and 2 ablations.        OB History   Grav Para Term Preterm Abortions TAB SAB Ect Mult Living   4 3        3       Past Medical History  Diagnosis Date  . Asthma   . Arthritis   . Grave's disease   . Hypertension   . Diabetes mellitus without complication   . Kidney stone   . Trigeminal neuralgia     Past Surgical History  Procedure Laterality Date  . Tubal ligation    . Tonsillectomy    . Lithotripsy    . Ablation      No family history on file.  History  Substance Use Topics  . Smoking status: Never Smoker   . Smokeless tobacco: Not on file  . Alcohol Use: No    Allergies:  Allergies  Allergen Reactions  . Ultram [Tramadol Hcl] Itching and Nausea Only    Prescriptions prior to admission  Medication Sig Dispense Refill  . albuterol (PROVENTIL HFA;VENTOLIN HFA) 108 (90 BASE) MCG/ACT inhaler Inhale 2 puffs into the lungs every 6 (six) hours as needed. For shortness of breath and wheezing      . budesonide-formoterol (SYMBICORT) 160-4.5 MCG/ACT inhaler Inhale 2 puffs into the lungs 2 (two) times daily.      . chlorpheniramine (CHLOR-TRIMETON) 4 MG tablet Take 4 mg by mouth once.      . cholecalciferol (VITAMIN D) 1000 UNITS tablet Take 2,000 Units by mouth daily.      . ferrous sulfate 325 (65 FE) MG EC tablet Take 975 mg by mouth 3 (three) times daily with meals.      Marland Kitchen glyBURIDE (DIABETA) 1.25 MG tablet Take 1.25 mg by mouth daily with breakfast.      . ibuprofen (ADVIL,MOTRIN) 200 MG tablet Take 400 mg by mouth every 6 (six) hours as needed.  For pain      . lisinopril-hydrochlorothiazide (PRINZIDE,ZESTORETIC) 10-12.5 MG per tablet Take 1 tablet by mouth daily.      Marland Kitchen METFORMIN HCL PO Take by mouth.      . Multiple Vitamin (MULITIVITAMIN WITH MINERALS) TABS Take 1 tablet by mouth daily.      . ondansetron (ZOFRAN) 4 MG tablet Take 1 tablet (4 mg total) by mouth every 8 (eight) hours as needed for nausea or vomiting.  8 tablet  0  . ondansetron (ZOFRAN) 4 MG tablet Take 1 tablet (4 mg total) by mouth every 6 (six) hours.  12 tablet  0  . vitamin B-12 (CYANOCOBALAMIN) 1000 MCG tablet Take 1,000 mcg by mouth daily.      . vitamin C (ASCORBIC ACID) 500 MG tablet Take 500 mg by mouth daily.        Review of Systems  Constitutional: Negative for fever, chills and malaise/fatigue.  Gastrointestinal: Positive for abdominal pain. Negative for nausea, vomiting, diarrhea and constipation.  Genitourinary: Negative for dysuria.       Vaginal bleeding   Neurological: Negative for dizziness and  headaches.   Physical Exam   Blood pressure 149/97, pulse 87, temperature 97.5 F (36.4 C), temperature source Oral, resp. rate 20, height 5' 5.5" (1.664 m), weight 111.494 kg (245 lb 12.8 oz).  Physical Exam  Constitutional: She is oriented to person, place, and time. She appears well-developed and well-nourished. No distress.  Cardiovascular: Normal rate.   Respiratory: Effort normal.  GI: Soft. There is no tenderness. There is no rebound and no guarding.  Genitourinary: Uterus normal. Vaginal discharge (moderate blood in vault, uterus enlarged, tender) found.  Musculoskeletal: Normal range of motion.  Neurological: She is alert and oriented to person, place, and time.  Skin: Skin is warm and dry.  Psychiatric: She has a normal mood and affect.    MAU Course  Procedures  MDM Results for orders placed during the hospital encounter of 08/08/13 (from the past 24 hour(s))  URINALYSIS, ROUTINE W REFLEX MICROSCOPIC     Status: Abnormal    Collection Time    08/08/13  9:00 PM      Result Value Ref Range   Color, Urine RED (*) YELLOW   APPearance CLOUDY (*) CLEAR   Specific Gravity, Urine >1.030 (*) 1.005 - 1.030   pH 5.5  5.0 - 8.0   Glucose, UA NEGATIVE  NEGATIVE mg/dL   Hgb urine dipstick LARGE (*) NEGATIVE   Bilirubin Urine SMALL (*) NEGATIVE   Ketones, ur 15 (*) NEGATIVE mg/dL   Protein, ur 30 (*) NEGATIVE mg/dL   Urobilinogen, UA 0.2  0.0 - 1.0 mg/dL   Nitrite NEGATIVE  NEGATIVE   Leukocytes, UA NEGATIVE  NEGATIVE  URINE MICROSCOPIC-ADD ON     Status: Abnormal   Collection Time    08/08/13  9:00 PM      Result Value Ref Range   Squamous Epithelial / LPF FEW (*) RARE   WBC, UA    <3 WBC/hpf   Value: NO FORMED ELEMENTS SEEN ON URINE MICROSCOPIC EXAMINATION   RBC / HPF TOO NUMEROUS TO COUNT  <3 RBC/hpf   Bacteria, UA FEW (*) RARE  POCT PREGNANCY, URINE     Status: None   Collection Time    08/08/13  9:52 PM      Result Value Ref Range   Preg Test, Ur NEGATIVE  NEGATIVE  CBC WITH DIFFERENTIAL     Status: Abnormal   Collection Time    08/08/13 10:00 PM      Result Value Ref Range   WBC 9.1  4.0 - 10.5 K/uL   RBC 4.38  3.87 - 5.11 MIL/uL   Hemoglobin 11.9 (*) 12.0 - 15.0 g/dL   HCT 35.1 (*) 36.0 - 46.0 %   MCV 80.1  78.0 - 100.0 fL   MCH 27.2  26.0 - 34.0 pg   MCHC 33.9  30.0 - 36.0 g/dL   RDW 15.1  11.5 - 15.5 %   Platelets 290  150 - 400 K/uL   Neutrophils Relative % 50  43 - 77 %   Neutro Abs 4.5  1.7 - 7.7 K/uL   Lymphocytes Relative 41  12 - 46 %   Lymphs Abs 3.8  0.7 - 4.0 K/uL   Monocytes Relative 8  3 - 12 %   Monocytes Absolute 0.7  0.1 - 1.0 K/uL   Eosinophils Relative 1  0 - 5 %   Eosinophils Absolute 0.1  0.0 - 0.7 K/uL   Basophils Relative 1  0 - 1 %   Basophils Absolute 0.1  0.0 -  0.1 K/uL   Today 11.9 (L)    4 Months Ago 12.2    5 Months Ago 12.0     US Transvaginal Non-ob  08/08/2013   CLINICAL DATA:  Severe pelvic cramping ; history of previous endometrial ablation  EXAM:  TRANSABDOMINAL AND TRANSVAGINAL ULTRASOUND OF PELVIS  TECHNIQUE: Both transabdominal and transvaginal ultrasound examinations of the pelvis were performed. Transabdominal technique was performed for global imaging of the pelvis including uterus, ovaries, adnexal regions, and pelvic cul-de-sac. It was necessary to proceed with endovaginal exam following the transabdominal exam to visualize the endometrium.  COMPARISON:  None    FINDINGS: Uterus  Measurements: 7.6 x 8.9 7.7 cm. There are multiple uterine fibroids demonstrated 1 within the fundus superiorly measures 3.5 cm in diameter. A second on the right in the mid body measures 6.3 x 8.2 x 4.5 cm. A third fibroid is subendometrial to the left of midline and measures 2.8 cm in diameter.  Endometrium  Thickness: 15 mm.  The endometrial stripe is poorly defined  Right ovary  Measurements: 5.5 x 3.9 x 4.0 cm. There is a right ovarian cyst measuring 3.4 cm in diameter.  Left ovary  Measurements: 3.3 x 2.1 x 1.8 cm. Normal appearance/no adnexal mass.  Other findings  No free fluid.    IMPRESSION: 1. The uterus is enlarged and demonstrates a lobulated contour and contains multiple fibroids. The largest demonstrated lies to the right in the mid body and measures 8.2 cm in greatest dimension.                           2. The endometrial stripe is thickened at 15 mm but was difficult to measure.                            3. There is a simple appearing cyst in right ovary measuring 3.4 cm in diameter.     Electronically Signed   By: David  Martinique   On: 08/08/2013 23:32    Assessment and Plan  A:  Dysmenorrhea       Uterine Fibroids, largest 8.2cm       S/P Ablation x 2, Thickened endometrium       Stable Hemoglobin  P:  Discussed findings       Recommend she return to GYN office, may need endometrial biopsy.         Rx small amt Vicodin       Undecided as to whether she wants to continue with her doctor in [facility] OB/GY[facility] offices. May  want to go to a new one        Seabron Spates 08/08/2013, 9:28 PM

## 2013-08-09 LAB — GC/CHLAMYDIA PROBE AMP
CT Probe RNA: NEGATIVE
GC Probe RNA: NEGATIVE

## 2013-08-10 NOTE — MAU Provider Note (Signed)
Attestation of Attending Supervision of Advanced Practitioner: Evaluation and management procedures were performed by the PA/NP/CNM/OB Fellow under my supervision/collaboration. Chart reviewed and agree with management and plan.  Jonnie Kind 08/10/2013 7:31 PM

## 2014-01-26 ENCOUNTER — Encounter (HOSPITAL_COMMUNITY): Payer: Self-pay | Admitting: *Deleted

## 2015-04-09 IMAGING — US US ABDOMEN COMPLETE
1 series · 14 of 25 positions shown · non-contrast
Comparison: CT, 05/04/2011

CLINICAL DATA: Nausea and vomiting.

EXAM:
ULTRASOUND ABDOMEN COMPLETE

[Series 1: us abdomen complete · 0.35mm/px · 14 of 64 slices shown]
[im 1/64]
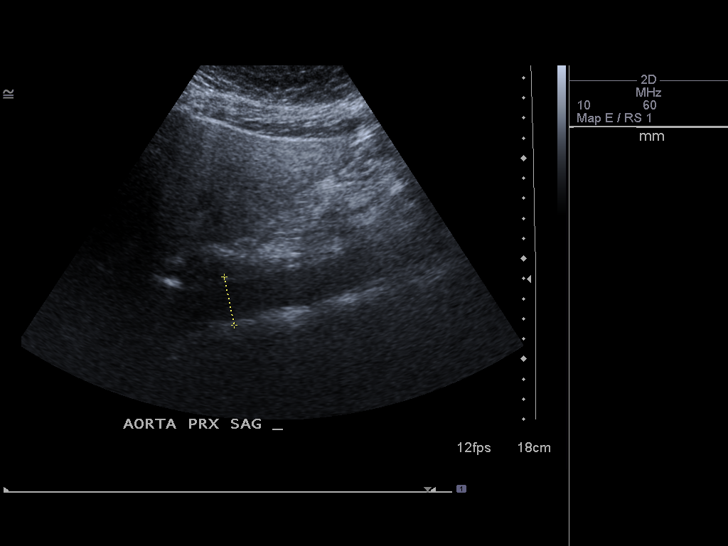
[im 6/64]
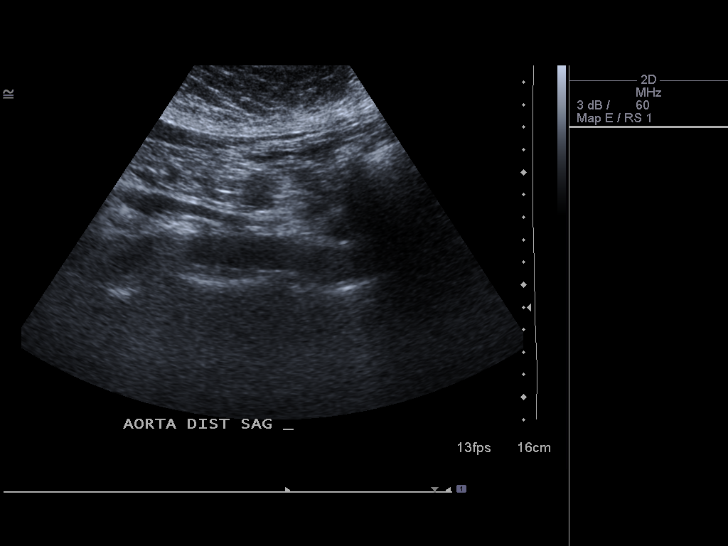
[im 11/64]
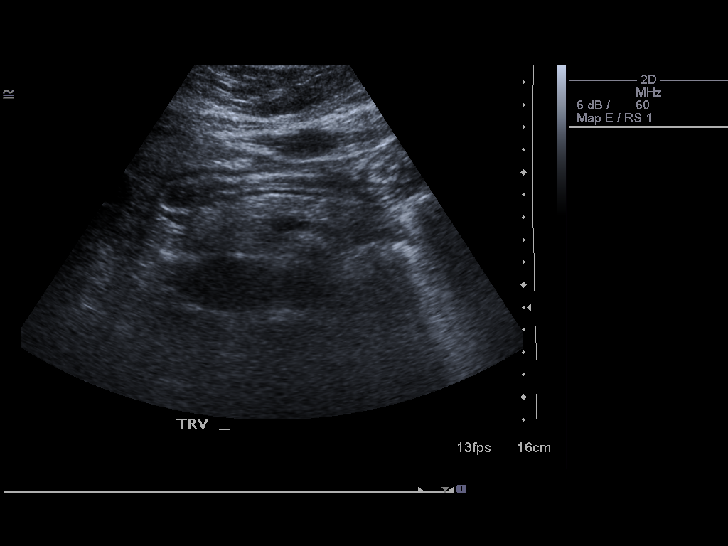
[im 16/64]
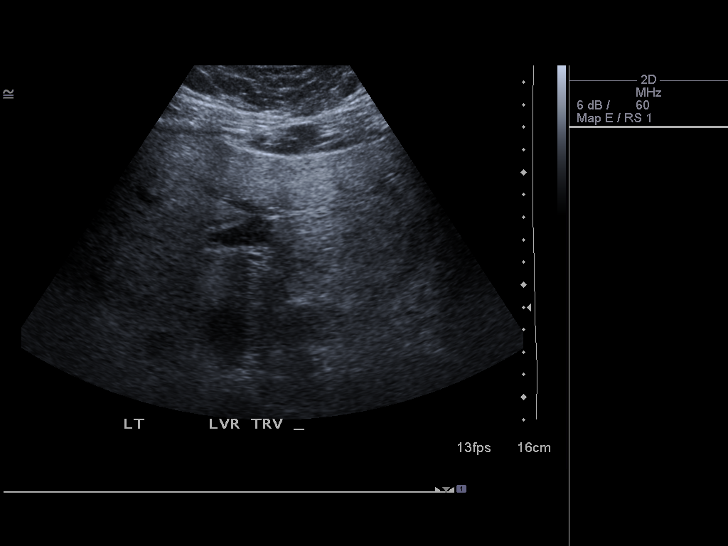
[im 22/64]
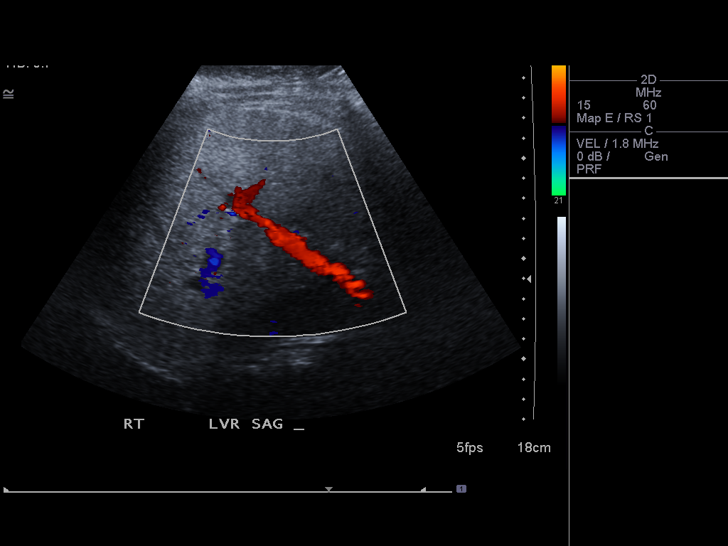
[im 24/64]
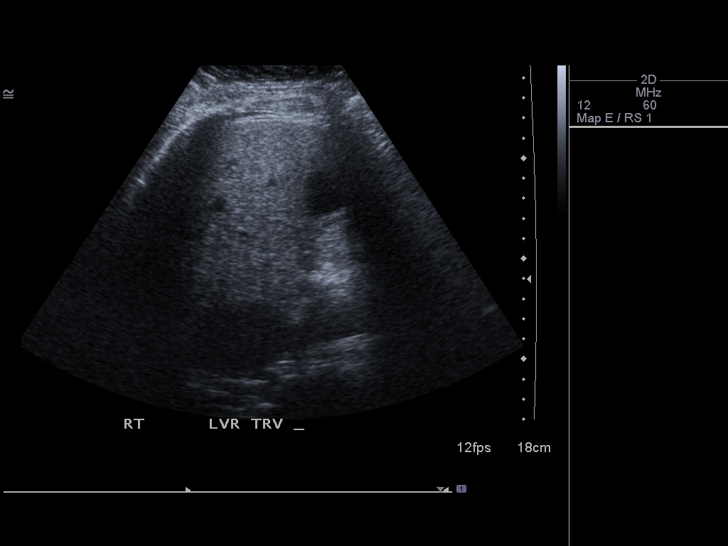
[im 29/64]
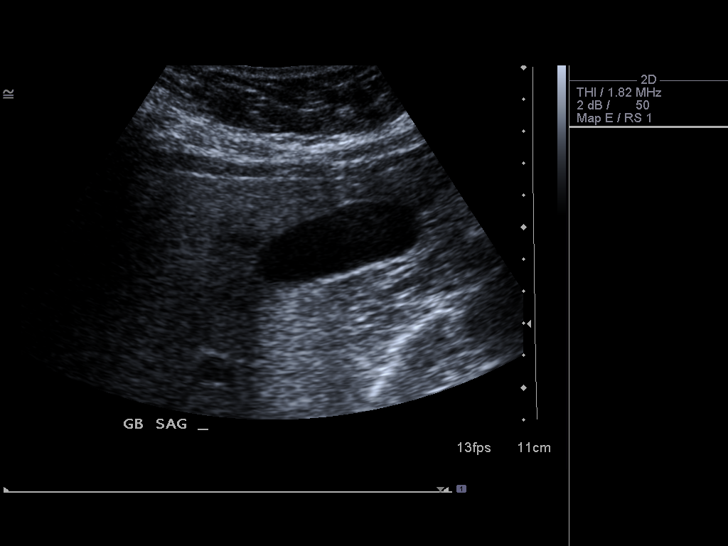
[im 35/64]
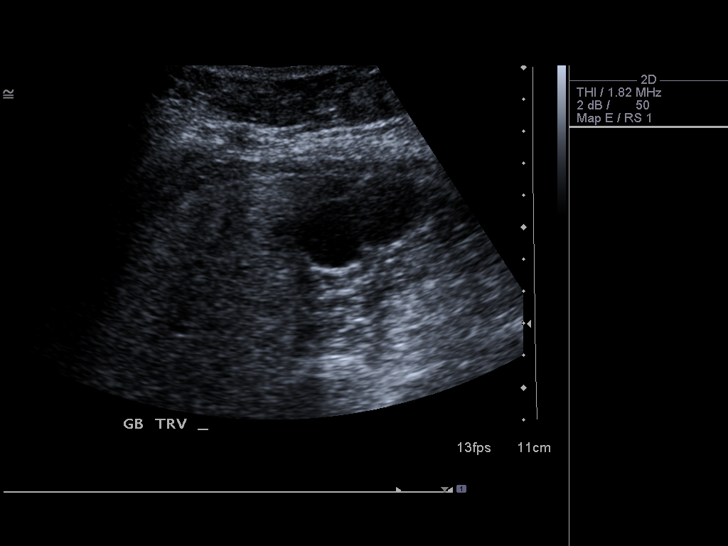
[im 40/64]
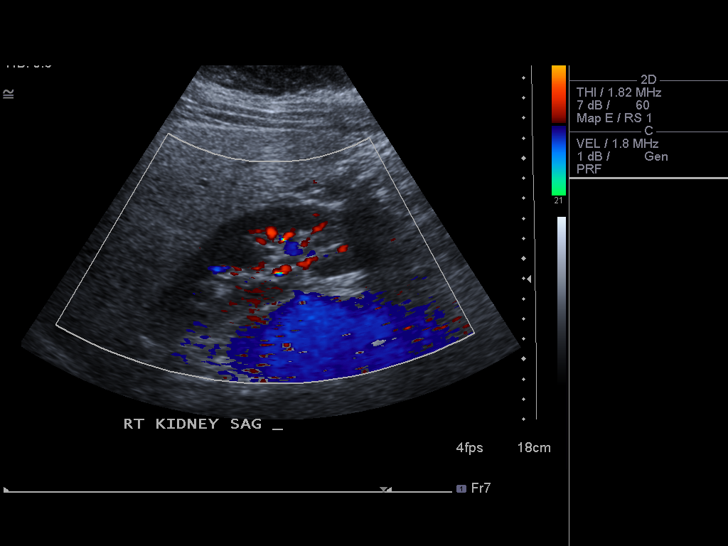
[im 43/64]
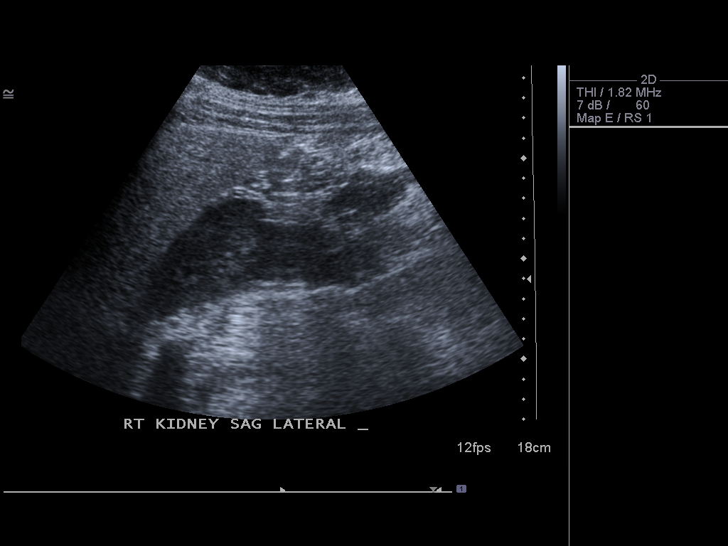
[im 48/64]
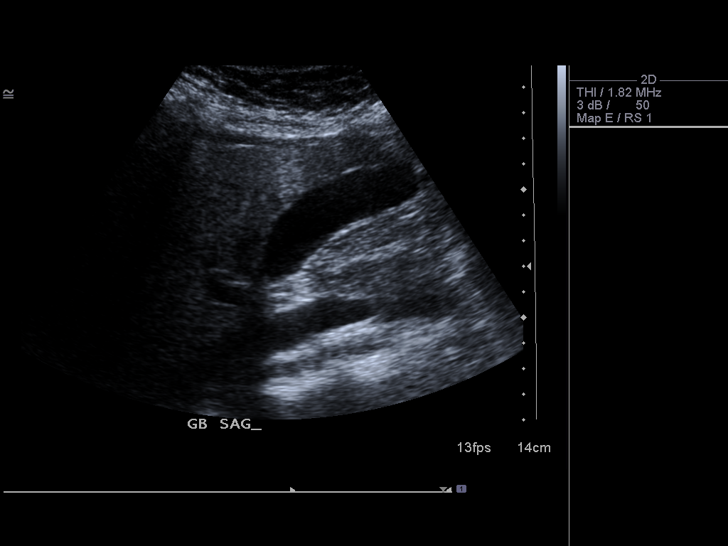
[im 53/64]
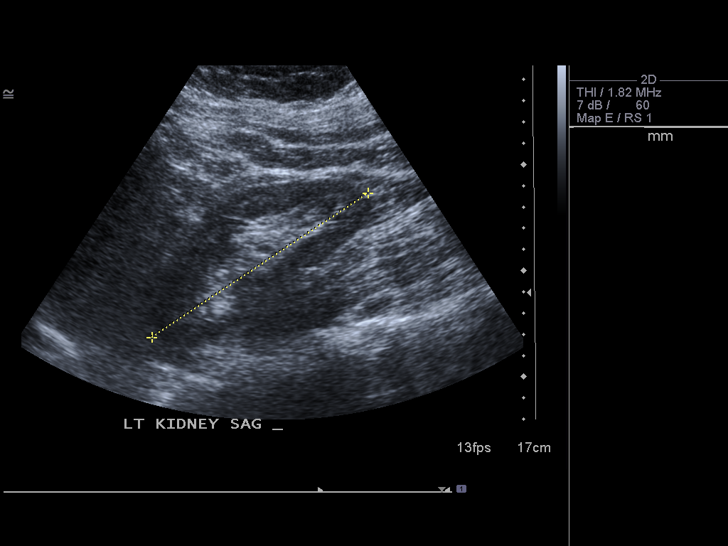
[im 58/64]
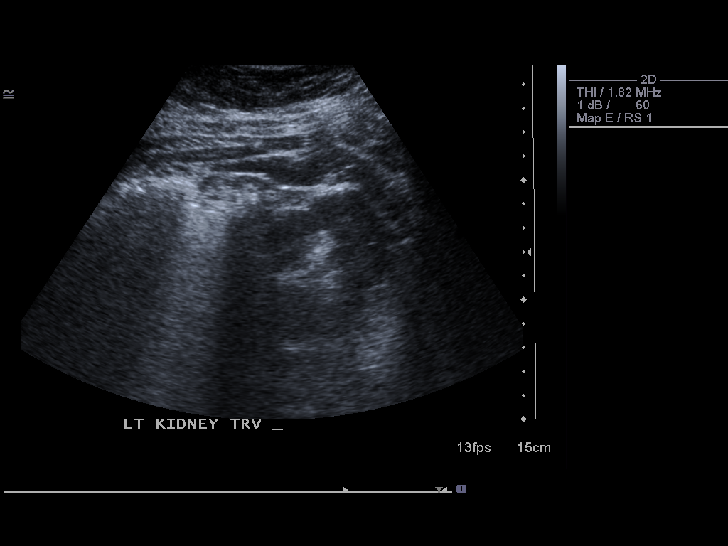
[im 64/64]
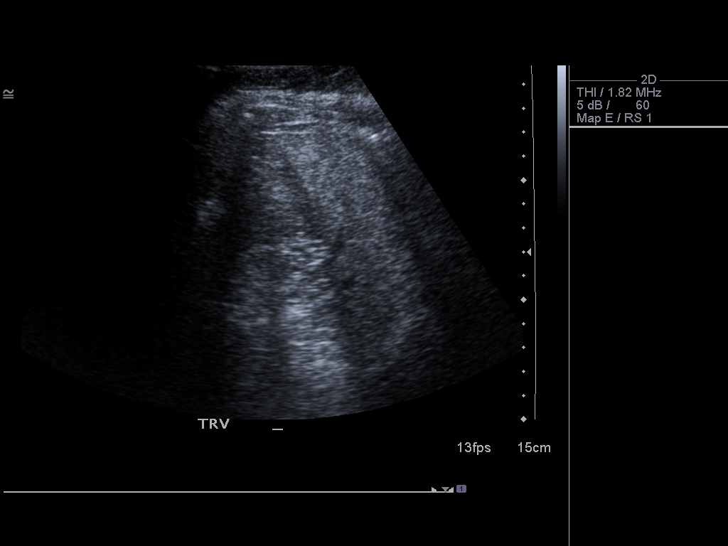

[14 of 25 positions shown; findings below may reference images not displayed]

FINDINGS: Gallbladder:

No gallstones or wall thickening visualized. No sonographic Murphy
sign noted.

Common bile duct:

Diameter: 3.5 mm

Liver:

Liver is echogenic with poor through transmission of the sound beam.
Liver is normal in size. No liver mass or focal lesion is seen.

IVC:

Limited visualization but grossly unremarkable.

Pancreas:

Minimally visualized.  Portions seen are unremarkable.

Spleen:

Size and appearance within normal limits.

Right Kidney:

Length: 12.7 cm. Echogenicity within normal limits. No mass or
hydronephrosis visualized.

Left Kidney:

Length: 12.2 cm. Echogenicity within normal limits. No mass or
hydronephrosis visualized.

Abdominal aorta:

Limited visualization.  No aneurysm is seen.

Other findings:

None.
IMPRESSION: 1. No acute findings.
2. Hepatic steatosis.
3. Midline structures not well evaluated.
4. Otherwise unremarkable.

## 2015-05-22 ENCOUNTER — Encounter (HOSPITAL_BASED_OUTPATIENT_CLINIC_OR_DEPARTMENT_OTHER): Payer: Self-pay | Admitting: Emergency Medicine

## 2015-05-22 ENCOUNTER — Emergency Department (HOSPITAL_BASED_OUTPATIENT_CLINIC_OR_DEPARTMENT_OTHER)
Admission: EM | Admit: 2015-05-22 | Discharge: 2015-05-22 | Disposition: A | Discharge status: Home / Self Care | Payer: BLUE CROSS/BLUE SHIELD | Attending: Emergency Medicine | Admitting: Emergency Medicine

## 2015-05-22 DIAGNOSIS — R51 Headache: Secondary | ICD-10-CM | POA: Diagnosis not present

## 2015-05-22 DIAGNOSIS — E119 Type 2 diabetes mellitus without complications: Secondary | ICD-10-CM | POA: Diagnosis not present

## 2015-05-22 DIAGNOSIS — J45909 Unspecified asthma, uncomplicated: Secondary | ICD-10-CM | POA: Insufficient documentation

## 2015-05-22 DIAGNOSIS — H748X1 Other specified disorders of right middle ear and mastoid: Secondary | ICD-10-CM | POA: Insufficient documentation

## 2015-05-22 DIAGNOSIS — R599 Enlarged lymph nodes, unspecified: Secondary | ICD-10-CM | POA: Diagnosis not present

## 2015-05-22 DIAGNOSIS — H9201 Otalgia, right ear: Secondary | ICD-10-CM | POA: Diagnosis present

## 2015-05-22 DIAGNOSIS — J3489 Other specified disorders of nose and nasal sinuses: Secondary | ICD-10-CM | POA: Diagnosis not present

## 2015-05-22 DIAGNOSIS — Z87442 Personal history of urinary calculi: Secondary | ICD-10-CM | POA: Diagnosis not present

## 2015-05-22 DIAGNOSIS — Z7951 Long term (current) use of inhaled steroids: Secondary | ICD-10-CM | POA: Diagnosis not present

## 2015-05-22 DIAGNOSIS — R0981 Nasal congestion: Secondary | ICD-10-CM | POA: Diagnosis not present

## 2015-05-22 DIAGNOSIS — H6591 Unspecified nonsuppurative otitis media, right ear: Secondary | ICD-10-CM

## 2015-05-22 DIAGNOSIS — Z79899 Other long term (current) drug therapy: Secondary | ICD-10-CM | POA: Diagnosis not present

## 2015-05-22 DIAGNOSIS — Z8669 Personal history of other diseases of the nervous system and sense organs: Secondary | ICD-10-CM | POA: Diagnosis not present

## 2015-05-22 DIAGNOSIS — I10 Essential (primary) hypertension: Secondary | ICD-10-CM | POA: Insufficient documentation

## 2015-05-22 MED ORDER — AMOXICILLIN 500 MG PO CAPS: 500.0000 mg | ORAL_CAPSULE | Freq: Three times a day (TID) | ORAL | Status: DC

## 2015-05-22 MED ORDER — IBUPROFEN 800 MG PO TABS
800.0000 mg | ORAL_TABLET | Freq: Once | ORAL | Status: AC
  Administered 2015-05-22: 800 mg via ORAL
  Filled 2015-05-22: qty 1

## 2015-05-22 NOTE — ED Provider Notes (Signed)
CSN: [id number]     Arrival date & time 05/22/15  2002 History   First MD Initiated Contact with Patient 05/22/15 2156     Chief Complaint  Patient presents with  . Otalgia     (Consider location/radiation/quality/duration/timing/severity/associated sxs/prior Treatment) The history is provided by the patient and medical records. No language interpreter was used.   Jillian Brown is a 45 y.o. female  with a hx of asthma, arthritis, HTN, Graves disease, NIDDM presents to the Emergency Department complaining of gradual, persistent, progressively worsening  Otalgia of the right ear beginning this morning. Patient reports that she has had nasal congestion and postnasal drip for approximately 10 days. She reports during this time she is at significant sinus pressure and discomfort. Reports in the last 48 hours the sinus pressure and pain has localized to the right side.   She has taken Bayer aspirin with moderate relief of the pain but is currently out.   She denies fever, chills, vertigo,  Changes in vision..     Past Medical History  Diagnosis Date  . Asthma   . Arthritis   . Grave's disease   . Hypertension   . Diabetes mellitus without complication ([place])   . Kidney stone   . Trigeminal neuralgia    Past Surgical History  Procedure Laterality Date  . Tubal ligation    . Tonsillectomy    . Lithotripsy    . Ablation     No family history on file. Social History  Substance Use Topics  . Smoking status: Never Smoker   . Smokeless tobacco: None  . Alcohol Use: Yes     Comment: rarely   OB History    Gravida Para Term Preterm AB TAB SAB Ectopic Multiple Living   4 3        3      Review of Systems  Constitutional: Negative for fever, diaphoresis, appetite change, fatigue and unexpected weight change.  HENT: Positive for congestion, ear pain, postnasal drip and sinus pressure. Negative for mouth sores.   Eyes: Negative for visual disturbance.  Respiratory: Negative for cough,  chest tightness, shortness of breath and wheezing.   Cardiovascular: Negative for chest pain.  Gastrointestinal: Negative for nausea, vomiting, abdominal pain, diarrhea and constipation.  Endocrine: Negative for polydipsia, polyphagia and polyuria.  Genitourinary: Negative for dysuria, urgency, frequency and hematuria.  Musculoskeletal: Negative for back pain and neck stiffness.  Skin: Negative for rash.  Allergic/Immunologic: Negative for immunocompromised state.  Neurological: Positive for headaches. Negative for syncope and light-headedness.  Hematological: Does not bruise/bleed easily.  Psychiatric/Behavioral: Negative for sleep disturbance. The patient is not nervous/anxious.       Allergies  Ultram  Home Medications   Prior to Admission medications   Medication Sig Start Date End Date Taking? Authorizing Provider  albuterol (PROVENTIL HFA;VENTOLIN HFA) 108 (90 BASE) MCG/ACT inhaler Inhale 2 puffs into the lungs every 6 (six) hours as needed. For shortness of breath and wheezing    Historical Provider, MD  amoxicillin (AMOXIL) 500 MG capsule Take 1 capsule (500 mg total) by mouth 3 (three) times daily. 05/22/15   Margarethe Virgen, PA-C  budesonide-formoterol (SYMBICORT) 160-4.5 MCG/ACT inhaler Inhale 2 puffs into the lungs 2 (two) times daily.    Historical Provider, MD  cholecalciferol (VITAMIN D) 1000 UNITS tablet Take 2,000 Units by mouth daily.    Historical Provider, MD  ferrous sulfate 325 (65 FE) MG EC tablet Take 975 mg by mouth 3 (three) times daily with meals.  Historical Provider, MD  Ginseng 100 MG CAPS Take 1 capsule by mouth daily.    Historical Provider, MD  Nyoka Cowden Tea 250 MG CAPS Take 1 capsule by mouth daily.    Historical Provider, MD  HYDROcodone-acetaminophen (NORCO/VICODIN) 5-325 MG per tablet Take 1 tablet by mouth every 6 (six) hours as needed. 08/08/13   Seabron Spates, CNM  ibuprofen (ADVIL,MOTRIN) 200 MG tablet Take 800 mg by mouth every 6 (six) hours  as needed for headache. For pain    Historical Provider, MD  lisinopril-hydrochlorothiazide (PRINZIDE,ZESTORETIC) 10-12.5 MG per tablet Take 1 tablet by mouth daily.    Historical Provider, MD  loratadine (CLARITIN) 10 MG tablet Take 10 mg by mouth daily.    Historical Provider, MD  Multiple Vitamin (MULITIVITAMIN WITH MINERALS) TABS Take 1 tablet by mouth daily.    Historical Provider, MD  Specialty Vitamins Products (WEIGHT LOSS DAILY MULTI PO) Take 1 tablet by mouth daily.    Historical Provider, MD   BP 131/89 mmHg  Pulse 77  Temp(Src) 98.7 F (37.1 C) (Oral)  Resp 18  Ht 5\' 5"  (1.651 m)  Wt 108.863 kg  BMI 39.94 kg/m2  SpO2 100%  LMP 04/30/2015 (Exact Date) Physical Exam  Constitutional: She is oriented to person, place, and time. She appears well-developed and well-nourished. No distress.  HENT:  Head: Normocephalic and atraumatic.  Right Ear: External ear and ear canal normal. Tympanic membrane is bulging. A middle ear effusion is present.  Left Ear: Tympanic membrane, external ear and ear canal normal.  Nose: Mucosal edema and rhinorrhea present. No epistaxis. Right sinus exhibits maxillary sinus tenderness and frontal sinus tenderness. Left sinus exhibits no maxillary sinus tenderness and no frontal sinus tenderness.  Mouth/Throat: Uvula is midline, oropharynx is clear and moist and mucous membranes are normal. Mucous membranes are not pale and not cyanotic. No oropharyngeal exudate, posterior oropharyngeal edema, posterior oropharyngeal erythema or tonsillar abscesses.  Eyes: Conjunctivae are normal. Pupils are equal, round, and reactive to light.  Neck: Normal range of motion and full passive range of motion without pain.  Cardiovascular: Normal rate and intact distal pulses.   Pulmonary/Chest: Effort normal and breath sounds normal. No stridor.  Clear and equal breath sounds without focal wheezes, rhonchi, rales  Abdominal: Soft. Bowel sounds are normal. There is no  tenderness.  Musculoskeletal: Normal range of motion.  Lymphadenopathy:       Head (right side): Submandibular and tonsillar adenopathy present. No submental, no preauricular, no posterior auricular and no occipital adenopathy present.       Head (left side): No submental, no submandibular, no tonsillar, no preauricular, no posterior auricular and no occipital adenopathy present.    She has no cervical adenopathy.   Right submandibular and tonsillar lymph nodes are discrete, mobile and tender  Neurological: She is alert and oriented to person, place, and time.  Skin: Skin is warm and dry. No rash noted. She is not diaphoretic.  Psychiatric: She has a normal mood and affect.  Nursing note and vitals reviewed.   ED Course  Procedures (including critical care time)  MDM   Final diagnoses:  Otalgia, right  Sinus congestion  Sinus pain  Middle ear effusion, right    Annalese Fante presents with right otalgia and 2 weeks of sinus congestion.   On exam patient with middle ear effusion concerning for early otitis media.   Patient also with right-sided maxillary and frontal sinus tenderness. No concern for acute mastoiditis, meningitis.  No antibiotic use  in the last month.  Patient discharged home with Amoxicillin.   I have also discussed reasons to return immediately to the ER.  Parent expresses understanding and agrees with plan.     Jarrett Soho Jabbar Palmero, PA-C 05/22/15 [place], MD 05/24/15 [phone]

## 2015-05-22 NOTE — Discharge Instructions (Signed)
1. Medications: Amoxicillin, usual home medications 2. Treatment: rest, drink plenty of fluids,  3. Follow Up: Please followup with your primary doctor in 2-3 days for discussion of your diagnoses and further evaluation after today's visit; if you do not have a primary care doctor use the resource guide provided to find one; Please return to the ER for worsening symptoms, bleeding or hearing lodd

## 2015-05-22 NOTE — ED Notes (Signed)
Pt states congestion and nasal drainage for past week.  Today states right ear pain and swollen lymph nodes "unbearable."

## 2015-07-11 IMAGING — US US PELVIS COMPLETE
1 series · 13 of 25 positions shown · non-contrast
Comparison: None

CLINICAL DATA: Severe pelvic cramping ; history of previous
endometrial ablation



[Series 1: us pelvis complete · 53 acquisitions, 13 frames shown]
[im 1/53]
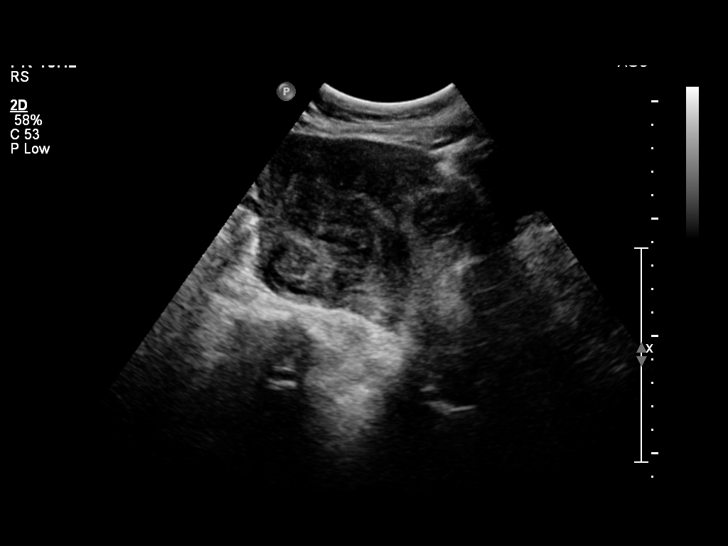
[im 5/53]
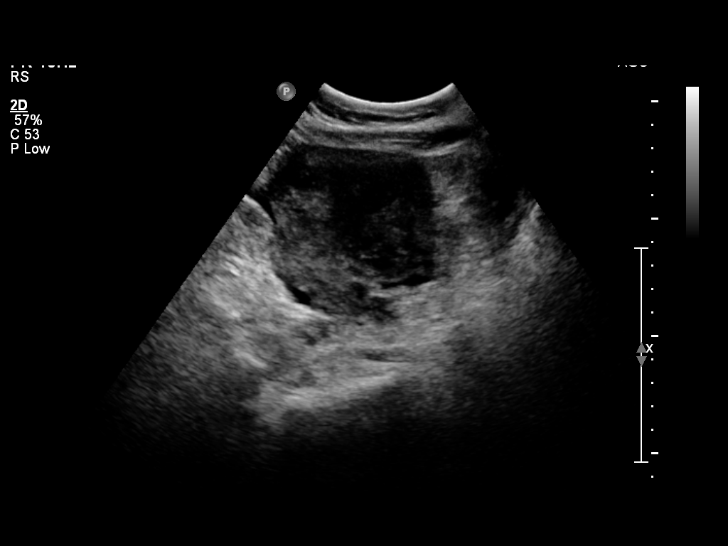
[im 9/53]
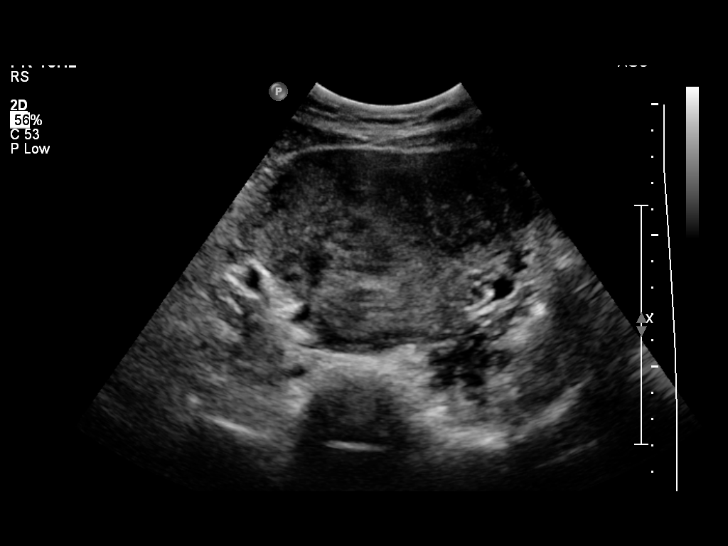
[im 14/53]
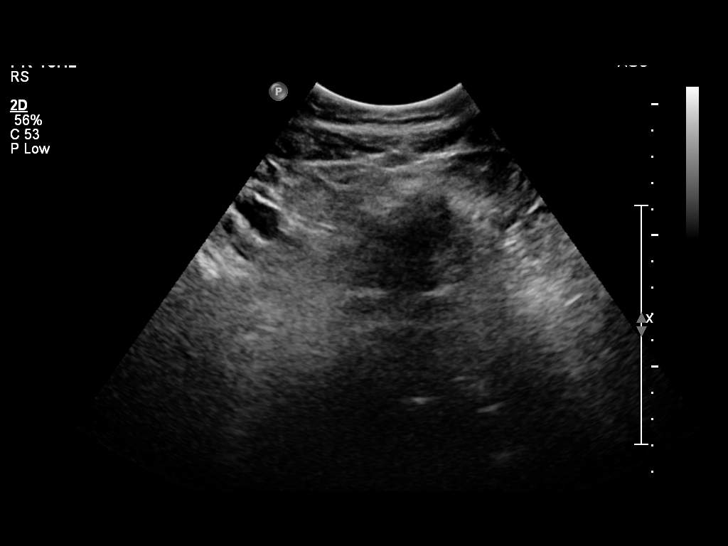
[im 18/53]
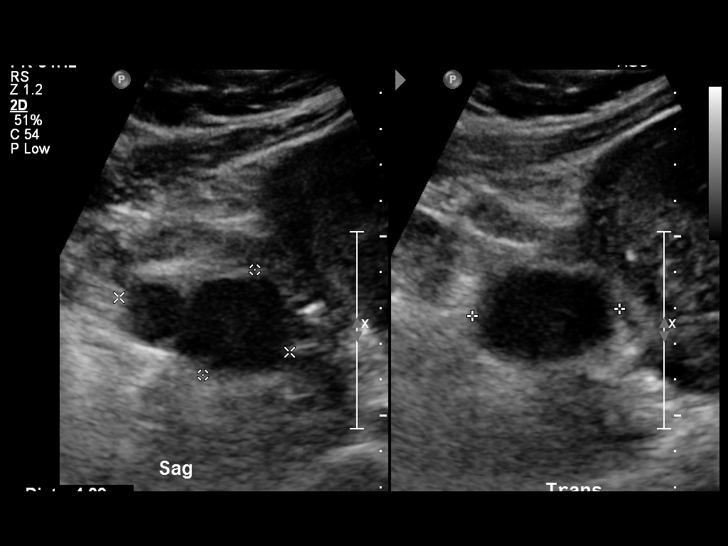
[im 22/53]
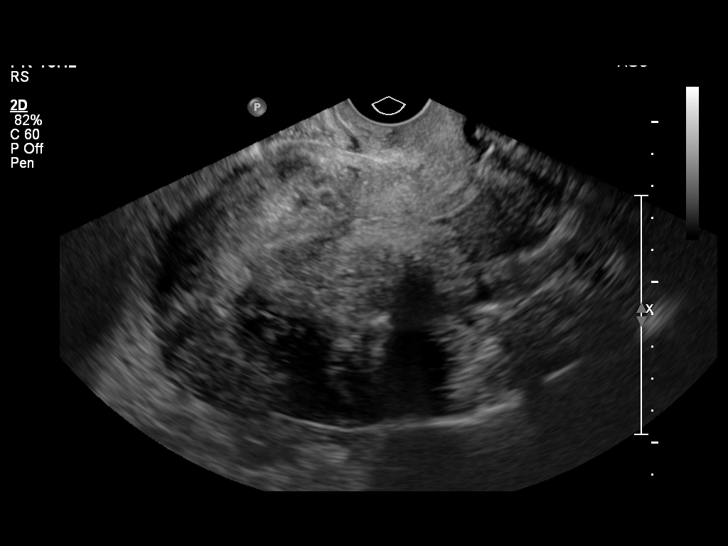
[im 27/53]
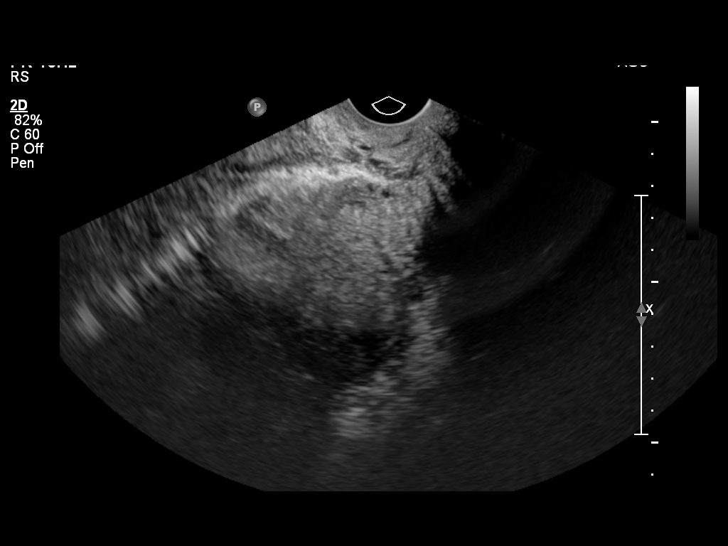
[im 31/53]
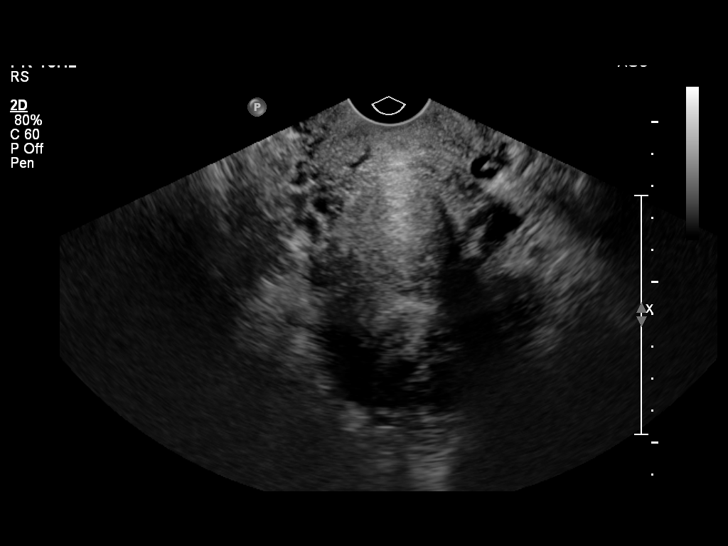
[im 35/53]
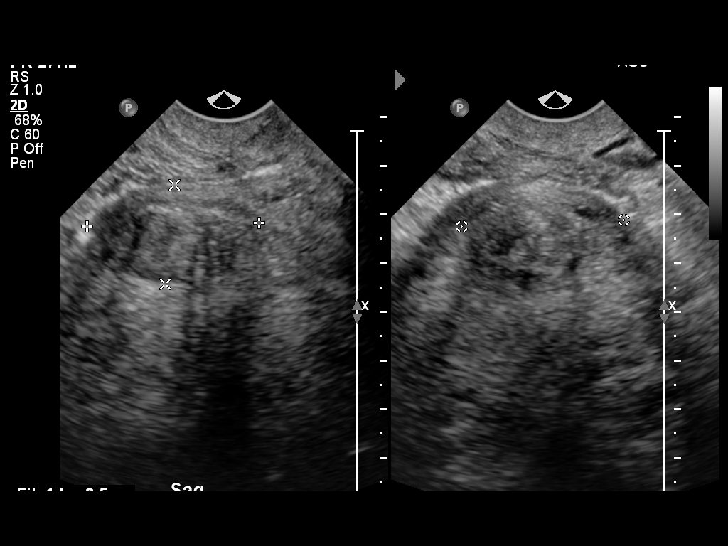
[im 40/53]
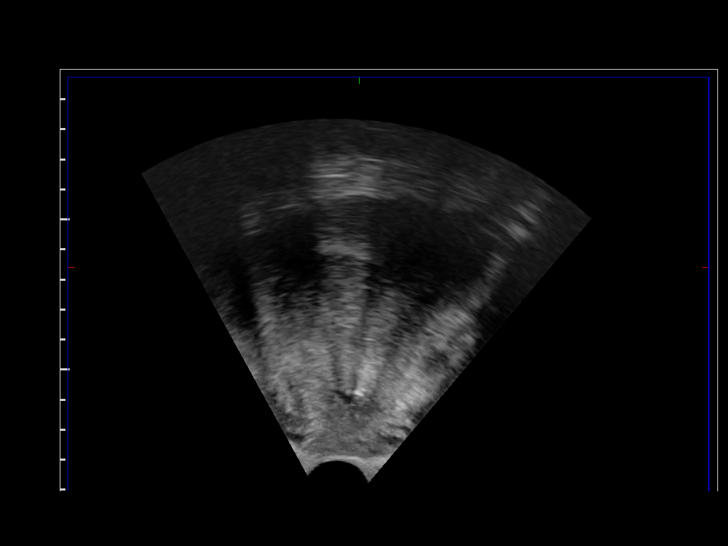
[im 44/53]
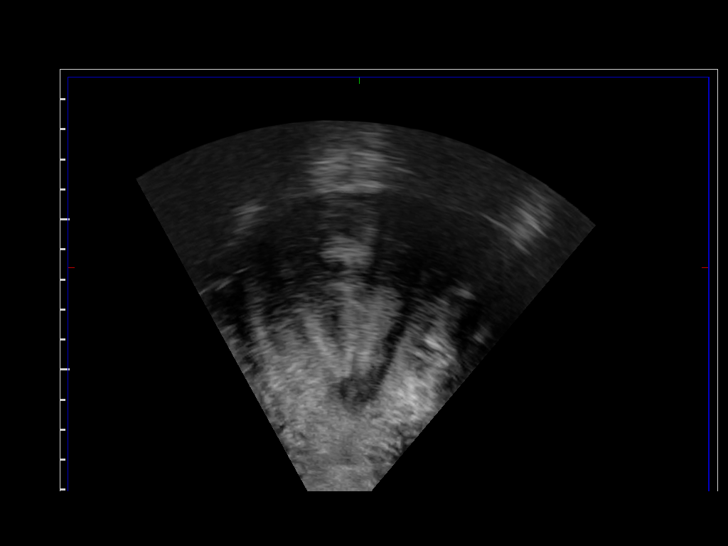
[im 48/53]
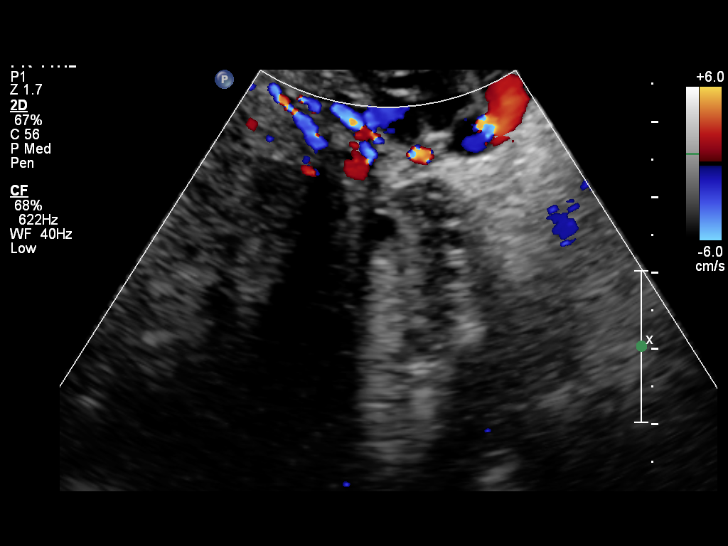
[im 53/53]
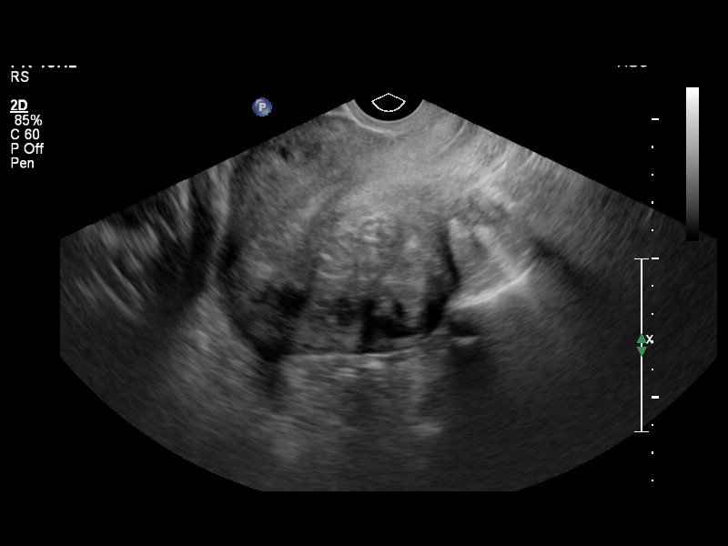

[13 of 25 positions shown; findings below may reference images not displayed]

FINDINGS: Uterus

Measurements: 7.6 x 8.9 7.7 cm.. There are multiple uterine fibroids
demonstrated 1 within the fundus superiorly measures 3.5 cm in
diameter. A second on the right in the mid body measures 6.3 x 8.2 x
4.5 cm. A third fibroid is subendometrial to the left of midline and
measures 2.8 cm in diameter.

Endometrium

Thickness: 15 mm.  The endometrial stripe is poorly defined

Right ovary

Measurements: 5.5 x 3.9 x 4.0 cm.. There is a right ovarian cyst
measuring 3.4 cm in diameter.

Left ovary

Measurements: 3.3 x 2.1 x 1.8 cm.. Normal appearance/no adnexal
mass.

Other findings

No free fluid.
IMPRESSION: 1. The uterus is enlarged and demonstrates a lobulated contour and
contains multiple fibroids. The largest demonstrated lies to the
right in the mid body and measures 8.2 cm in greatest dimension.
2. The endometrial stripe is thickened at 15 mm but was difficult to
measure.
3. There is a simple appearing cyst in right ovary measuring 3.4 cm
in diameter.

## 2015-09-20 ENCOUNTER — Encounter (HOSPITAL_BASED_OUTPATIENT_CLINIC_OR_DEPARTMENT_OTHER): Payer: Self-pay

## 2015-09-20 ENCOUNTER — Emergency Department (HOSPITAL_BASED_OUTPATIENT_CLINIC_OR_DEPARTMENT_OTHER)
Admission: EM | Admit: 2015-09-20 | Discharge: 2015-09-20 | Disposition: A | Discharge status: Home / Self Care | Payer: BLUE CROSS/BLUE SHIELD | Attending: Emergency Medicine | Admitting: Emergency Medicine

## 2015-09-20 DIAGNOSIS — E119 Type 2 diabetes mellitus without complications: Secondary | ICD-10-CM | POA: Insufficient documentation

## 2015-09-20 DIAGNOSIS — M1990 Unspecified osteoarthritis, unspecified site: Secondary | ICD-10-CM | POA: Insufficient documentation

## 2015-09-20 DIAGNOSIS — I10 Essential (primary) hypertension: Secondary | ICD-10-CM | POA: Insufficient documentation

## 2015-09-20 DIAGNOSIS — K111 Hypertrophy of salivary gland: Secondary | ICD-10-CM

## 2015-09-20 DIAGNOSIS — J45909 Unspecified asthma, uncomplicated: Secondary | ICD-10-CM | POA: Diagnosis not present

## 2015-09-20 DIAGNOSIS — R51 Headache: Secondary | ICD-10-CM | POA: Diagnosis present

## 2015-09-20 MED ORDER — OXYCODONE-ACETAMINOPHEN 5-325 MG PO TABS
2.0000 | ORAL_TABLET | Freq: Once | ORAL | Status: AC
  Administered 2015-09-20: 2 via ORAL
  Filled 2015-09-20: qty 2

## 2015-09-20 MED ORDER — ONDANSETRON 4 MG PO TBDP
4.0000 mg | ORAL_TABLET | Freq: Once | ORAL | Status: AC
  Administered 2015-09-20: 4 mg via ORAL
  Filled 2015-09-20: qty 1

## 2015-09-20 MED ORDER — OXYCODONE-ACETAMINOPHEN 5-325 MG PO TABS
1.0000 | ORAL_TABLET | Freq: Four times a day (QID) | ORAL | Status: AC | PRN
Start: 2015-09-20 — End: ?

## 2015-09-20 MED ORDER — NAPROXEN 375 MG PO TABS: 375.0000 mg | ORAL_TABLET | Freq: Two times a day (BID) | ORAL | Status: AC

## 2015-09-20 NOTE — ED Notes (Signed)
C/o pain to left side of face and neck since Friday-denies injury-NAD-steady gait

## 2015-09-20 NOTE — ED Notes (Signed)
Pt teaching provided on medications that may cause drowsiness. Pt instructed not to drive or operate heavy machinery while taking the prescribed medication. Pt verbalized understanding.   

## 2015-09-20 NOTE — ED Notes (Signed)
Pt and family concerned that the provider has not been in to see the pt and her facial pain is getting worse. Pt remains in NAD with unlabored quite respirations. Provided pt with a warm wash cloth to help with the pain.

## 2015-09-20 NOTE — Discharge Instructions (Signed)
Parotitis Parotitis is soreness and inflammation of one or both parotid glands. The parotid glands produce saliva. They are located on each side of the face, below and in front of the earlobes. The saliva produced comes out of tiny openings (ducts) inside the cheeks. In most cases, parotitis goes away over time or with treatment. If your parotitis is caused by certain long-term (chronic) diseases, it may come back again.  CAUSES  Parotitis can be caused by:  Viral infections. Mumps is one viral infection that can cause parotitis.  Bacterial infections.  Blockage of the salivary ducts due to a salivary stone.  Narrowing of the salivary ducts.  Swelling of the salivary ducts.  Dehydration.  Autoimmune conditions, such as sarcoidosis or Sjogren syndrome.  Air from activities such as scuba diving, glass blowing, or playing an instrument (rare).  Human immunodeficiency virus (HIV) or acquired immunodeficiency syndrome (AIDS).  Tuberculosis. SIGNS AND SYMPTOMS   The ears may appear to be pushed up and out from their normal position.  Redness (erythema) of the skin over the parotid glands.  Pain and tenderness over the parotid glands.  Swelling in the parotid gland area.  Yellowish-white fluid (pus) coming from the ducts inside the cheeks.  Dry mouth.  Bad taste in the mouth. DIAGNOSIS  Your health care provider may determine that you have parotitis based on your symptoms and a physical exam. A sample of fluid may also be taken from the parotid gland and tested to find the cause of your infection. X-rays or computed tomography (CT) scans may be taken if your health care provider thinks you might have a salivary stone blocking your salivary duct. TREATMENT  Treatment varies depending upon the cause of your parotitis. If your parotitis is caused by mumps, no treatment is needed. The condition will go away on its own after 7 to 10 days. In other cases, treatment may  include:  Antibiotic medicine if your infection was caused by bacteria.  Pain medicines.  Gland massage.  Eating sour candy to increase your saliva production.  Removal of salivary stones. Your health care provider may flush stones out with fluids or remove them with tweezers.  Surgery to remove the parotid glands. HOME CARE INSTRUCTIONS   If you were prescribed an antibiotic medicine, finish it all even if you start to feel better.  Put warm compresses on the sore area.  Take medicines only as directed by your health care provider.  Drink enough fluids to keep your urine clear or pale yellow. SEEK IMMEDIATE MEDICAL CARE IF:   You have increasing pain or swelling that is not controlled with medicine.  You have a fever. MAKE SURE YOU:  Understand these instructions.  Will watch your condition.  Will get help right away if you are not doing well or get worse.   This information is not intended to replace advice given to you by your health care provider. Make sure you discuss any questions you have with your health care provider.   Document Released: 09/02/2001 Document Revised: 04/03/2014 Document Reviewed: 08/06/2014 Elsevier Interactive Patient Education 2016 [employer]  

## 2015-09-20 NOTE — ED Provider Notes (Signed)
CSN: [id number]     Arrival date & time 09/20/15  1642 History   First MD Initiated Contact with Patient 09/20/15 1750     Chief Complaint  Patient presents with  . Facial Pain     (Consider location/radiation/quality/duration/timing/severity/associated sxs/prior Treatment) HPI  Jillian Brown is a(n) 45 y.o. female who presents to the Emergency department with chief complaint of left-sided facial pain. She has a past medical history of asthma, Graves' disease, hypertension, uncomplicated, diabetes, kidney stone, and trigeminal neuralgia. Patient states that she has had progressively worsening pain under her left jaw that radiates into her left ear, left side of her throat and left masseter region for the past 2 days. She rates her pain as severe. Nothing seems to make it worse or better. She has associated headache, has tried over-the-counter pain medicine without relief of her symptoms. She denies dental pain, difficulty swallowing, fevers, chills.  Past Medical History  Diagnosis Date  . Asthma   . Arthritis   . Grave's disease   . Hypertension   . Diabetes mellitus without complication ([place])   . Kidney stone   . Trigeminal neuralgia    Past Surgical History  Procedure Laterality Date  . Tubal ligation    . Tonsillectomy    . Lithotripsy    . Ablation     No family history on file. Social History  Substance Use Topics  . Smoking status: Never Smoker   . Smokeless tobacco: None  . Alcohol Use: Yes     Comment: rarely   OB History    Gravida Para Term Preterm AB TAB SAB Ectopic Multiple Living   4 3        3      Review of Systems  Ten systems reviewed and are negative for acute change, except as noted in the HPI.    Allergies  Ultram  Home Medications   Prior to Admission medications   Medication Sig Start Date End Date Taking? Authorizing Provider  albuterol (PROVENTIL HFA;VENTOLIN HFA) 108 (90 BASE) MCG/ACT inhaler Inhale 2 puffs into the lungs every 6 (six)  hours as needed. For shortness of breath and wheezing    Historical Provider, MD  naproxen (NAPROSYN) 375 MG tablet Take 1 tablet (375 mg total) by mouth 2 (two) times daily. 09/20/15   Margarita Mail, PA-C  oxyCODONE-acetaminophen (PERCOCET) 5-325 MG tablet Take 1 tablet by mouth every 6 (six) hours as needed for severe pain. 09/20/15   Zamariya Neal Varnell, PA-C   BP 149/93 mmHg  Pulse 89  Temp(Src) 98.5 F (36.9 C) (Oral)  Resp 18  Ht 5' 5.5" (1.664 m)  Wt 107.956 kg  BMI 38.99 kg/m2  SpO2 100%  LMP 09/20/2015 Physical Exam  Constitutional: She is oriented to person, place, and time. She appears well-developed and well-nourished. No distress.  HENT:  Head: Normocephalic.  Right Ear: Hearing, tympanic membrane and external ear normal.  Left Ear: Hearing and tympanic membrane normal.  Mouth/Throat: Uvula is midline.   Normal jaw opening without clicking or popping. No signs of abscess or infection. No sublingual tenderness or swelling.    Eyes: Conjunctivae are normal. No scleral icterus.  Neck: Normal range of motion.    Normal phonation without stridor  Cardiovascular: Normal rate, regular rhythm and normal heart sounds.  Exam reveals no gallop and no friction rub.   No murmur heard. Pulmonary/Chest: Effort normal and breath sounds normal. No respiratory distress.  Abdominal: Soft. Bowel sounds are normal. She exhibits no distension and  no mass. There is no tenderness. There is no guarding.  Neurological: She is alert and oriented to person, place, and time.  Skin: Skin is warm and dry. She is not diaphoretic.  Nursing note and vitals reviewed.   ED Course  Procedures (including critical care time) Labs Review Labs Reviewed - No data to display  Imaging Review No results found. I have personally reviewed and evaluated these images and lab results as part of my medical decision-making.   EKG Interpretation None      MDM   Final diagnoses:  Enlarged salivary gland     6:48 PM Patient with  Likely dx of blocked salivary gland. No signs of infection.  Will d/c with sialogogues, nsaids, and pain medication. F/u with ENT. DIscussed return precautions.    Margarita Mail, PA-C 09/20/15 1848  Gareth Morgan, MD 09/21/15 1341

## 2019-05-27 ENCOUNTER — Ambulatory Visit: Payer: Self-pay | Admitting: Rheumatology

## 2019-06-26 ENCOUNTER — Ambulatory Visit: Payer: Self-pay | Admitting: Rheumatology

## 2022-05-03 ENCOUNTER — Emergency Department (HOSPITAL_BASED_OUTPATIENT_CLINIC_OR_DEPARTMENT_OTHER)
Admission: EM | Admit: 2022-05-03 | Discharge: 2022-05-03 | Disposition: A | Discharge status: Home / Self Care | Payer: BC Managed Care – PPO | Attending: Emergency Medicine | Admitting: Emergency Medicine

## 2022-05-03 ENCOUNTER — Emergency Department (HOSPITAL_BASED_OUTPATIENT_CLINIC_OR_DEPARTMENT_OTHER): Payer: BC Managed Care – PPO

## 2022-05-03 ENCOUNTER — Other Ambulatory Visit: Payer: Self-pay

## 2022-05-03 DIAGNOSIS — R319 Hematuria, unspecified: Secondary | ICD-10-CM | POA: Diagnosis present

## 2022-05-03 DIAGNOSIS — R310 Gross hematuria: Secondary | ICD-10-CM

## 2022-05-03 DIAGNOSIS — R109 Unspecified abdominal pain: Secondary | ICD-10-CM

## 2022-05-03 DIAGNOSIS — D72829 Elevated white blood cell count, unspecified: Secondary | ICD-10-CM | POA: Insufficient documentation

## 2022-05-03 DIAGNOSIS — R300 Dysuria: Secondary | ICD-10-CM | POA: Insufficient documentation

## 2022-05-03 DIAGNOSIS — E876 Hypokalemia: Secondary | ICD-10-CM | POA: Diagnosis not present

## 2022-05-03 LAB — CBC WITH DIFFERENTIAL/PLATELET
Abs Immature Granulocytes: 0.02 10*3/uL (ref 0.00–0.07)
Basophils Absolute: 0.1 10*3/uL (ref 0.0–0.1)
Basophils Relative: 1 %
Eosinophils Absolute: 0.1 10*3/uL (ref 0.0–0.5)
Eosinophils Relative: 1 %
HCT: 40.8 % (ref 36.0–46.0)
Hemoglobin: 13.7 g/dL (ref 12.0–15.0)
Immature Granulocytes: 0 %
Lymphocytes Relative: 37 %
Lymphs Abs: 4.8 10*3/uL — ABNORMAL HIGH (ref 0.7–4.0)
MCH: 28 pg (ref 26.0–34.0)
MCHC: 33.6 g/dL (ref 30.0–36.0)
MCV: 83.3 fL (ref 80.0–100.0)
Monocytes Absolute: 0.8 10*3/uL (ref 0.1–1.0)
Monocytes Relative: 6 %
Neutro Abs: 7.1 10*3/uL (ref 1.7–7.7)
Neutrophils Relative %: 55 %
Platelets: 251 10*3/uL (ref 150–400)
RBC: 4.9 MIL/uL (ref 3.87–5.11)
RDW: 14.2 % (ref 11.5–15.5)
WBC: 12.8 10*3/uL — ABNORMAL HIGH (ref 4.0–10.5)
nRBC: 0 % (ref 0.0–0.2)

## 2022-05-03 LAB — URINALYSIS, MICROSCOPIC (REFLEX): RBC / HPF: >50 RBC/hpf (ref 0–5)

## 2022-05-03 LAB — BASIC METABOLIC PANEL
Anion gap: 10 (ref 5–15)
BUN: 13 mg/dL (ref 6–20)
CO2: 24 mmol/L (ref 22–32)
Calcium: 9.2 mg/dL (ref 8.9–10.3)
Chloride: 102 mmol/L (ref 98–111)
Creatinine, Ser: 0.74 mg/dL (ref 0.44–1.00)
GFR, Estimated: >60 mL/min (ref >60)
Glucose, Bld: 97 mg/dL (ref 70–99)
Potassium: 3.2 mmol/L — ABNORMAL LOW (ref 3.5–5.1)
Sodium: 136 mmol/L (ref 135–145)

## 2022-05-03 LAB — URINALYSIS, ROUTINE W REFLEX MICROSCOPIC
Glucose, UA: NEGATIVE mg/dL
Ketones, ur: 15 mg/dL — AB
Nitrite: NEGATIVE
Protein, ur: >=300 mg/dL — AB
Specific Gravity, Urine: 1.025 (ref 1.005–1.030)
pH: 5.5 (ref 5.0–8.0)

## 2022-05-03 LAB — PREGNANCY, URINE: Preg Test, Ur: NEGATIVE

## 2022-05-03 MED ORDER — CEPHALEXIN 500 MG PO CAPS: 500.0000 mg | ORAL_CAPSULE | Freq: Four times a day (QID) | ORAL | 0 refills | Status: AC

## 2022-05-03 MED ORDER — PHENAZOPYRIDINE HCL 200 MG PO TABS: 200.0000 mg | ORAL_TABLET | Freq: Three times a day (TID) | ORAL | 0 refills | Status: AC

## 2022-05-03 MED ORDER — PHENAZOPYRIDINE HCL 100 MG PO TABS
200.0000 mg | ORAL_TABLET | Freq: Once | ORAL | Status: AC
  Administered 2022-05-03: 200 mg via ORAL
  Filled 2022-05-03: qty 2

## 2022-05-03 MED ORDER — CEPHALEXIN 250 MG PO CAPS
500.0000 mg | ORAL_CAPSULE | Freq: Once | ORAL | Status: AC
  Administered 2022-05-03: 500 mg via ORAL
  Filled 2022-05-03: qty 2

## 2022-05-03 MED ORDER — POTASSIUM CHLORIDE CRYS ER 20 MEQ PO TBCR
40.0000 meq | EXTENDED_RELEASE_TABLET | Freq: Once | ORAL | Status: AC
  Administered 2022-05-03: 40 meq via ORAL
  Filled 2022-05-03: qty 2

## 2022-05-03 NOTE — Discharge Instructions (Addendum)
You are seen in the emergency department for urinary frequency and dysuria along with blood in your urine and right flank pain.  Your CAT scan did not show an obvious kidney stone.  Your urine did show signs of blood and possible infection and we are starting you on an antibiotic.  Please drink plenty of fluids and rest.  Follow-up with your primary care doctor.  Return to the emergency department if any worsening or concerning symptoms.

## 2022-05-03 NOTE — ED Notes (Signed)
Patient transported to CT 

## 2022-05-03 NOTE — ED Triage Notes (Signed)
Patient presents to ED via POV from home. Here with right flank pain, dysuria and hematuria.

## 2022-05-03 NOTE — ED Notes (Signed)
Pt A&OX4 ambulatory at d/c with independent steady gait. Pt verbalized understanding of d/c instructions, prescriptions and follow up care. Pt given opportunity to ask questions.

## 2022-05-03 NOTE — ED Provider Notes (Signed)
[facility] Provider Note   CSN: [id number] Arrival date & time: 05/03/22  1814     History {Add pertinent medical, surgical, social history, OB history to HPI:1} Chief Complaint  Patient presents with   Hematuria    Jillian Brown is a 52 y.o. female.  She is here with a complaint of blood in her urine and right-sided flank pain that started earlier today.  There is a little bit of dysuria at the very end of her urination.  She is status post ablation and does not have vaginal bleeding.  She has noticed no other bleeding and is not on any blood thinners.  She has a prior history of a kidney stone but it was many years ago but she says it feels a little like that.  No fevers or chills.  The history is provided by the patient.  Hematuria This is a new problem. The current episode started 6 to 12 hours ago. The problem has not changed since onset.Pertinent negatives include no chest pain, no abdominal pain, no headaches and no shortness of breath. Nothing aggravates the symptoms. Nothing relieves the symptoms. She has tried nothing for the symptoms. The treatment provided no relief.       Home Medications Prior to Admission medications   Medication Sig Start Date End Date Taking? Authorizing Provider  albuterol (PROVENTIL HFA;VENTOLIN HFA) 108 (90 BASE) MCG/ACT inhaler Inhale 2 puffs into the lungs every 6 (six) hours as needed. For shortness of breath and wheezing    [provider]  naproxen (NAPROSYN) 375 MG tablet Take 1 tablet (375 mg total) by mouth 2 (two) times daily. 09/20/15   Margarita Mail, PA-C  oxyCODONE-acetaminophen (PERCOCET) 5-325 MG tablet Take 1 tablet by mouth every 6 (six) hours as needed for severe pain. 09/20/15   Margarita Mail, PA-C      Allergies    Ultram [place] Mode hcl]    Review of Systems   Review of Systems  Constitutional:  Negative for fever.  Respiratory:  Negative for shortness of breath.    Cardiovascular:  Negative for chest pain.  Gastrointestinal:  Negative for abdominal pain.  Genitourinary:  Positive for dysuria, flank pain and hematuria.  Neurological:  Negative for headaches.    Physical Exam Updated Vital Signs BP 129/89   Pulse 87   Temp 98.9 F (37.2 C) (Oral)   Resp 17   Ht '5\' 6"'$  (1.676 m)   Wt 104.3 kg   SpO2 98%   BMI 37.12 kg/m  Physical Exam Vitals and nursing note reviewed.  Constitutional:      General: She is not in acute distress.    Appearance: Normal appearance. She is well-developed.  HENT:     Head: Normocephalic and atraumatic.  Eyes:     Conjunctiva/sclera: Conjunctivae normal.  Cardiovascular:     Rate and Rhythm: Normal rate and regular rhythm.     Heart sounds: No murmur heard. Pulmonary:     Effort: Pulmonary effort is normal. No respiratory distress.     Breath sounds: Normal breath sounds.  Abdominal:     Palpations: Abdomen is soft.     Tenderness: There is no abdominal tenderness. There is no guarding or rebound.  Musculoskeletal:     Cervical back: Neck supple.  Skin:    General: Skin is warm and dry.     Capillary Refill: Capillary refill takes less than 2 seconds.  Neurological:     General: No focal deficit  present.     Mental Status: She is alert.     ED Results / Procedures / Treatments   Labs (all labs ordered are listed, but only abnormal results are displayed) Labs Reviewed  URINALYSIS, ROUTINE W REFLEX MICROSCOPIC - Abnormal; Notable for the following components:      Result Value   Color, Urine BROWN (*)    APPearance TURBID (*)    Hgb urine dipstick LARGE (*)    Bilirubin Urine MODERATE (*)    Ketones, ur 15 (*)    Protein, ur >=300 (*)    Leukocytes,Ua SMALL (*)    All other components within normal limits  URINALYSIS, MICROSCOPIC (REFLEX) - Abnormal; Notable for the following components:   Bacteria, UA FEW (*)    All other components within normal limits  PREGNANCY, URINE     EKG None  Radiology No results found.  Procedures Procedures  {Document cardiac monitor, telemetry assessment procedure when appropriate:1}  Medications Ordered in ED Medications - No data to display  ED Course/ Medical Decision Making/ A&P   {   Click here for ABCD2, HEART and other calculatorsREFRESH Note before signing :1}                          Medical Decision Making Amount and/or Complexity of Data Reviewed Labs: ordered. Radiology: ordered.   This patient complains of ***; this involves an extensive number of treatment Options and is a complaint that carries with it a high risk of complications and morbidity. The differential includes ***  I ordered, reviewed and interpreted labs, which included *** I ordered medication *** and reviewed PMP when indicated. I ordered imaging studies which included *** and I independently    visualized and interpreted imaging which showed *** Additional history obtained from *** Previous records obtained and reviewed *** I consulted *** and discussed lab and imaging findings and discussed disposition.  Cardiac monitoring reviewed, *** Social determinants considered, *** Critical Interventions: ***  After the interventions stated above, I reevaluated the patient and found *** Admission and further testing considered, ***   {Document critical care time when appropriate:1} {Document review of labs and clinical decision tools ie heart score, Chads2Vasc2 etc:1}  {Document your independent review of radiology images, and any outside records:1} {Document your discussion with family members, caretakers, and with consultants:1} {Document social determinants of health affecting pt's care:1} {Document your decision making why or why not admission, treatments were needed:1} Final Clinical Impression(s) / ED Diagnoses Final diagnoses:  None    Rx / DC Orders ED Discharge Orders     None

## 2022-06-14 ENCOUNTER — Encounter (HOSPITAL_COMMUNITY): Payer: Self-pay

## 2022-06-14 ENCOUNTER — Ambulatory Visit (HOSPITAL_COMMUNITY)
Admission: EM | Admit: 2022-06-14 | Discharge: 2022-06-14 | Disposition: A | Discharge status: Home / Self Care | Payer: BC Managed Care – PPO

## 2022-06-14 DIAGNOSIS — J45901 Unspecified asthma with (acute) exacerbation: Secondary | ICD-10-CM | POA: Diagnosis not present

## 2022-06-14 DIAGNOSIS — J302 Other seasonal allergic rhinitis: Secondary | ICD-10-CM | POA: Diagnosis not present

## 2022-06-14 MED ORDER — BUDESONIDE 90 MCG/ACT IN AEPB: 2.0000 | INHALATION_SPRAY | Freq: Two times a day (BID) | RESPIRATORY_TRACT | 1 refills | Status: AC

## 2022-06-14 MED ORDER — ALBUTEROL SULFATE (2.5 MG/3ML) 0.083% IN NEBU
2.5000 mg | INHALATION_SOLUTION | Freq: Once | RESPIRATORY_TRACT | Status: AC
  Administered 2022-06-14: 2.5 mg via RESPIRATORY_TRACT

## 2022-06-14 MED ORDER — ALBUTEROL SULFATE (2.5 MG/3ML) 0.083% IN NEBU
INHALATION_SOLUTION | RESPIRATORY_TRACT | Status: AC
  Filled 2022-06-14: qty 3

## 2022-06-14 MED ORDER — DIPHENHYDRAMINE HCL 25 MG PO CAPS
25.0000 mg | ORAL_CAPSULE | Freq: Once | ORAL | Status: AC
  Administered 2022-06-14: 25 mg via ORAL

## 2022-06-14 MED ORDER — DIPHENHYDRAMINE HCL 25 MG PO CAPS
ORAL_CAPSULE | ORAL | Status: AC
  Filled 2022-06-14: qty 1

## 2022-06-14 MED ORDER — SPACER/AERO-HOLDING CHAMBERS DEVI: 1.0000 | 0 refills | Status: AC | PRN

## 2022-06-14 MED ORDER — PREDNISONE 20 MG PO TABS: 40.0000 mg | ORAL_TABLET | Freq: Every day | ORAL | 0 refills | Status: AC

## 2022-06-14 NOTE — ED Provider Notes (Signed)
[facility]    CSN: [id number] Arrival date & time: 06/14/22  0844      History   Chief Complaint Chief Complaint  Patient presents with   Cough   Shortness of Breath    HPI Jillian Brown is a 52 y.o. female. She reports cough, wheezing, head congestion. Patient has seasonal allergies and prone to sinus infections. Does not feel sick, feels like she is having severe allergy symptoms not sufficiently relieved by her usual meds. Walked in the park on 06/10/22 and woke up the next day with these symptoms. Does not use preventive asthma medicine because she rarely needs to use her albuterol. Does have allergy flares in spring and fall. Pt reports wheezing last night and chest tightness.    Cough Associated symptoms: shortness of breath   Shortness of Breath Associated symptoms: cough     Past Medical History:  Diagnosis Date   Arthritis    Asthma    Diabetes mellitus without complication ([place])    Grave's disease    Hypertension    Kidney stone    Trigeminal neuralgia     There are no problems to display for this patient.   Past Surgical History:  Procedure Laterality Date   ABLATION     LITHOTRIPSY     TONSILLECTOMY     TUBAL LIGATION      OB History     Gravida  4   Para  3   Term      Preterm      AB      Living  3      SAB      IAB      Ectopic      Multiple      Live Births               Home Medications    Prior to Admission medications   Medication Sig Start Date End Date Taking? Authorizing Provider  albuterol (PROVENTIL HFA;VENTOLIN HFA) 108 (90 BASE) MCG/ACT inhaler Inhale 2 puffs into the lungs every 6 (six) hours as needed. For shortness of breath and wheezing   Yes [provider]  atorvastatin (LIPITOR) 40 MG tablet Take 40 mg by mouth daily. 01/17/22  Yes [provider]  Budesonide 90 MCG/ACT inhaler Inhale 2 puffs into the lungs 2 (two) times daily. 06/14/22  Yes Carvel Getting, NP   fluticasone (FLONASE) 50 MCG/ACT nasal spray Place 2 sprays into both nostrils daily. 01/16/22  Yes [provider]  losartan-hydrochlorothiazide (HYZAAR) 50-12.5 MG tablet Take 1 tablet by mouth daily. 01/16/22  Yes [provider]  metFORMIN (GLUCOPHAGE-XR) 500 MG 24 hr tablet Take 500 mg by mouth 2 (two) times daily with a meal. 01/16/22  Yes [provider]  montelukast (SINGULAIR) 10 MG tablet Take 10 mg by mouth at bedtime. 01/16/22  Yes [provider]  Multiple Vitamin (MULTI-VITAMINS) TABS Take 1 tablet by mouth daily.   Yes [provider]  naproxen (NAPROSYN) 375 MG tablet Take 1 tablet (375 mg total) by mouth 2 (two) times daily. 09/20/15  Yes Desantis, Abigail, PA-C  oxyCODONE-acetaminophen (PERCOCET) 5-325 MG tablet Take 1 tablet by mouth every 6 (six) hours as needed for severe pain. 09/20/15  Yes Clay, Abigail, PA-C  OZEMPIC, 0.25 OR 0.5 MG/DOSE, 2 MG/3ML SOPN Inject 0.5 mg into the skin once a week. 05/21/22  Yes [provider]  predniSONE (DELTASONE) 20 MG tablet Take 2 tablets (40 mg total) by  mouth daily for 5 days. 06/14/22 06/19/22 Yes Carvel Getting, NP  Spacer/Aero-Holding Josiah Lobo DEVI 1 each by Does not apply route as needed. 06/14/22  Yes Carvel Getting, NP  cephALEXin (KEFLEX) 500 MG capsule Take 1 capsule (500 mg total) by mouth 4 (four) times daily. 05/03/22   Hayden Rasmussen, MD  phenazopyridine (PYRIDIUM) 200 MG tablet Take 1 tablet (200 mg total) by mouth 3 (three) times daily. 05/03/22   Hayden Rasmussen, MD    Family History History reviewed. No pertinent family history.  Social History Social History   Tobacco Use   Smoking status: Never  Substance Use Topics   Alcohol use: Yes    Comment: rarely   Drug use: No     Allergies   Ultram [tramadol hcl], Iodinated contrast media, and Latex   Review of Systems Review of Systems  Respiratory:  Positive for cough and shortness of breath.       Physical Exam Triage Vital Signs ED Triage Vitals  Enc Vitals Group     BP 06/14/22 0956 129/88     Pulse Rate 06/14/22 0956 85     Resp 06/14/22 0956 16     Temp 06/14/22 0956 98.3 F (36.8 C)     Temp Source 06/14/22 0956 Oral     SpO2 06/14/22 0956 96 %     Weight 06/14/22 0956 233 lb (105.7 kg)     Height 06/14/22 0956 5\' 6"  (1.676 m)     Head Circumference --      Peak Flow --      Pain Score 06/14/22 0951 9     Pain Loc --      Pain Edu? --      Excl. in [place]? --    No data found.  Updated Vital Signs BP 129/88 (BP Location: Right Arm)   Pulse 85   Temp 98.3 F (36.8 C) (Oral)   Resp 16   Ht 5\' 6"  (1.676 m)   Wt 233 lb (105.7 kg)   LMP  (LMP Unknown)   SpO2 96%   BMI 37.61 kg/m   Visual Acuity Right Eye Distance:   Left Eye Distance:   Bilateral Distance:    Right Eye Near:   Left Eye Near:    Bilateral Near:     Physical Exam Constitutional:      General: She is not in acute distress.    Appearance: She is well-developed. She is not ill-appearing.  HENT:     Nose: Congestion present.  Cardiovascular:     Rate and Rhythm: Normal rate and regular rhythm.  Pulmonary:     Effort: Pulmonary effort is normal.     Breath sounds: Normal breath sounds.  Neurological:     Mental Status: She is alert.      UC Treatments / Results  Labs (all labs ordered are listed, but only abnormal results are displayed) Labs Reviewed - No data to display  EKG   Radiology No results found.  Procedures Procedures (including critical care time)  Medications Ordered in UC Medications  albuterol (PROVENTIL) (2.5 MG/3ML) 0.083% nebulizer solution 2.5 mg (2.5 mg Nebulization Given 06/14/22 1020)  diphenhydrAMINE (BENADRYL) capsule 25 mg (25 mg Oral Given 06/14/22 1109)    Initial Impression / Assessment and Plan / UC Course  I have reviewed the triage vital signs and the nursing notes.  Pertinent labs & imaging results that were available during my care  of the patient were reviewed by  me and considered in my medical decision making (see chart for details).     Pt sx improved signficantly after albuterol nebulizer. I still do not hear wheezing and breath sounds still normal. Discussed seasonal aspect of her allergies and the timing of this asthma flare. Will rx prednisone and also budesonide inhaler to use for this spring. She will talk with her PCP about using preventive medicine in spring and fall   Final Clinical Impressions(s) / UC Diagnoses   Final diagnoses:  Exacerbation of asthma, unspecified asthma severity, unspecified whether persistent  Seasonal allergies     Discharge Instructions      Take Claritin (or generic loratadine) or Zyrtec (or generic cetirizine) once a day while allergies are bothering you. You can take a benadryl (or generic diphenhydramine) at night as well if needed.   Use the budesonide inhaler probably through mid- April at least, possibly longer, depending on pollen counts. Use the spacer with your albuterol inhaler if you need the inhaler.   Talk with your regular healthcare provider about needing a steroid inhaler/preventive inhaler in the spring and fall. And consider wearing masks when outside when pollen levels are high.    ED Prescriptions     Medication Sig Dispense Auth. Provider   Spacer/Aero-Holding Josiah Lobo DEVI 1 each by Does not apply route as needed. 1 each Carvel Getting, NP   predniSONE (DELTASONE) 20 MG tablet Take 2 tablets (40 mg total) by mouth daily for 5 days. 10 tablet Carvel Getting, NP   Budesonide 90 MCG/ACT inhaler Inhale 2 puffs into the lungs 2 (two) times daily. 1 each Carvel Getting, NP      PDMP not reviewed this encounter.   Carvel Getting, NP 06/14/22 1134

## 2022-06-14 NOTE — Discharge Instructions (Signed)
Take Claritin (or generic loratadine) or Zyrtec (or generic cetirizine) once a day while allergies are bothering you. You can take a benadryl (or generic diphenhydramine) at night as well if needed.   Use the budesonide inhaler probably through mid- April at least, possibly longer, depending on pollen counts. Use the spacer with your albuterol inhaler if you need the inhaler.   Talk with your regular healthcare provider about needing a steroid inhaler/preventive inhaler in the spring and fall. And consider wearing masks when outside when pollen levels are high.

## 2022-06-14 NOTE — ED Triage Notes (Signed)
Cough, wheezing, head congestion. Patient has seasonal allergies and prone to sinus infections. Onset Sunday. No known sick exposure. Patient taking Singulair, Flonase, Mucinex, and a nebulizer treatment with slight relief.

## 2022-10-02 ENCOUNTER — Encounter (HOSPITAL_BASED_OUTPATIENT_CLINIC_OR_DEPARTMENT_OTHER): Payer: Self-pay

## 2022-10-02 ENCOUNTER — Other Ambulatory Visit: Payer: Self-pay

## 2022-10-02 ENCOUNTER — Emergency Department (HOSPITAL_BASED_OUTPATIENT_CLINIC_OR_DEPARTMENT_OTHER): Payer: BC Managed Care – PPO

## 2022-10-02 ENCOUNTER — Emergency Department (HOSPITAL_BASED_OUTPATIENT_CLINIC_OR_DEPARTMENT_OTHER)
Admission: EM | Admit: 2022-10-02 | Discharge: 2022-10-02 | Disposition: A | Discharge status: Home / Self Care | Payer: BC Managed Care – PPO | Attending: Emergency Medicine | Admitting: Emergency Medicine

## 2022-10-02 DIAGNOSIS — X501XXA Overexertion from prolonged static or awkward postures, initial encounter: Secondary | ICD-10-CM | POA: Insufficient documentation

## 2022-10-02 DIAGNOSIS — M25571 Pain in right ankle and joints of right foot: Secondary | ICD-10-CM | POA: Diagnosis present

## 2022-10-02 DIAGNOSIS — Z91040 Latex allergy status: Secondary | ICD-10-CM | POA: Insufficient documentation

## 2022-10-02 MED ORDER — OXYCODONE-ACETAMINOPHEN 5-325 MG PO TABS
1.0000 | ORAL_TABLET | Freq: Once | ORAL | Status: AC
  Administered 2022-10-02: 1 via ORAL
  Filled 2022-10-02: qty 1

## 2022-10-02 MED ORDER — IBUPROFEN 600 MG PO TABS: 600.0000 mg | ORAL_TABLET | Freq: Four times a day (QID) | ORAL | 0 refills | Status: AC | PRN

## 2022-10-02 MED ORDER — IBUPROFEN 400 MG PO TABS
600.0000 mg | ORAL_TABLET | Freq: Once | ORAL | Status: AC
  Administered 2022-10-02: 600 mg via ORAL
  Filled 2022-10-02: qty 1

## 2022-10-02 NOTE — ED Triage Notes (Signed)
Pt reports she tripped when stepping down onto a step and fell down on to the floor. She landed on her right knee and reports pain to right ankle. This happened tonight at the air port around 1700. Swelling noted to right ankle. + pedal pulse, + sensation. She is ambulatory. Denies head injury.

## 2022-10-02 NOTE — Discharge Instructions (Addendum)
We suspect that you likely have high-grade ankle sprain.  X-ray is not indicative of fracture. RICE treatment is recommended.  Weight bearing allowed as tolerated.  If you are not improving over the next 2 or 3 days, please call the orthopedic doctor for a follow-up appointment.

## 2022-10-02 NOTE — ED Provider Notes (Signed)
[facility] Provider Note   CSN: [id number] Arrival date & time: 10/02/22  2111     History  Chief Complaint  Patient presents with   Ankle Pain    Jillian Brown is a 52 y.o. female.  HPI     SUBJECTIVE: Jillian Brown is a 52 y.o. female who complains of inversion injury to the right ankle 5 hour(s) ago. Immediate symptoms: immediate pain, immediate swelling, was able to bear weight directly after injury. Symptoms have been acute since that time. Prior history of related problems: no prior problems with this area in the past. There is pain and swelling at the lateral aspect of that ankle.   Home Medications Prior to Admission medications   Medication Sig Start Date End Date Taking? Authorizing Provider  ibuprofen (ADVIL) 600 MG tablet Take 1 tablet (600 mg total) by mouth every 6 (six) hours as needed. 10/02/22  Yes Adayah Arocho, MD  albuterol (PROVENTIL HFA;VENTOLIN HFA) 108 (90 BASE) MCG/ACT inhaler Inhale 2 puffs into the lungs every 6 (six) hours as needed. For shortness of breath and wheezing    [provider]  atorvastatin (LIPITOR) 40 MG tablet Take 40 mg by mouth daily. 01/17/22   [provider]  Budesonide 90 MCG/ACT inhaler Inhale 2 puffs into the lungs 2 (two) times daily. 06/14/22   Cathlyn Parsons, NP  cephALEXin (KEFLEX) 500 MG capsule Take 1 capsule (500 mg total) by mouth 4 (four) times daily. 05/03/22   Terrilee Files, MD  fluticasone [facility]) 50 MCG/ACT nasal spray Place 2 sprays into both nostrils daily. 01/16/22   [provider]  losartan-hydrochlorothiazide (HYZAAR) 50-12.5 MG tablet Take 1 tablet by mouth daily. 01/16/22   [provider]  metFORMIN (GLUCOPHAGE-XR) 500 MG 24 hr tablet Take 500 mg by mouth 2 (two) times daily with a meal. 01/16/22   [provider]  montelukast (SINGULAIR) 10 MG tablet Take 10 mg by mouth at bedtime. 01/16/22   [provider]  Multiple Vitamin (MULTI-VITAMINS) TABS Take 1 tablet by mouth daily.    [provider]  naproxen (NAPROSYN) 375 MG tablet Take 1 tablet (375 mg total) by mouth 2 (two) times daily. 09/20/15   Arthor Captain, PA-C  oxyCODONE-acetaminophen (PERCOCET) 5-325 MG tablet Take 1 tablet by mouth every 6 (six) hours as needed for severe pain. 09/20/15   Zern, Abigail, PA-C  OZEMPIC, 0.25 OR 0.5 MG/DOSE, 2 MG/3ML SOPN Inject 0.5 mg into the skin once a week. 05/21/22   [provider]  phenazopyridine (PYRIDIUM) 200 MG tablet Take 1 tablet (200 mg total) by mouth 3 (three) times daily. 05/03/22   Terrilee Files, MD  Spacer/Aero-Holding Deretha Emory DEVI 1 each by Does not apply route as needed. 06/14/22   Cathlyn Parsons, NP      Allergies    Ultram Marcia Brash hcl], Iodinated contrast media, and Latex    Review of Systems   Review of Systems  All other systems reviewed and are negative.   Physical Exam Updated Vital Signs BP 129/83 (BP Location: Right Arm)   Pulse 85   Temp 98.4 F (36.9 C)   Resp 18   Ht 5\' 6"  (1.676 m)   Wt 105.7 kg   LMP  (LMP Unknown)   SpO2 100%   BMI 37.61 kg/m  Physical Exam Vitals and nursing note reviewed.  Constitutional:      Appearance: She is well-developed.  HENT:  Head: Atraumatic.  Cardiovascular:     Rate and Rhythm: Normal rate.  Pulmonary:     Effort: Pulmonary effort is normal.  Musculoskeletal:        General: Swelling and tenderness present.     Cervical back: Normal range of motion and neck supple.  Skin:    General: Skin is warm and dry.  Neurological:     Mental Status: She is alert and oriented to person, place, and time.     ED Results / Procedures / Treatments   Labs (all labs ordered are listed, but only abnormal results are displayed) Labs Reviewed - No data to display  EKG None  Radiology DG Ankle Complete Right  Result Date: 10/02/2022 CLINICAL DATA:  Injury EXAM: RIGHT ANKLE - COMPLETE 3+ VIEW  COMPARISON:  None Available. FINDINGS: Diffuse soft tissue swelling. Small plantar calcaneal spur. Mortise symmetric. No acute fracture is seen. Remote injury or ossicle adjacent to the dorsal navicular. IMPRESSION: Soft tissue swelling. No acute osseous abnormality. Electronically Signed   By: Jasmine Pang M.D.   On: 10/02/2022 21:56    Procedures Procedures    Medications Ordered in ED Medications  oxyCODONE-acetaminophen (PERCOCET/ROXICET) 5-325 MG per tablet 1 tablet (has no administration in time range)  ibuprofen (ADVIL) tablet 600 mg (has no administration in time range)    ED Course/ Medical Decision Making/ A&P                             Medical Decision Making Amount and/or Complexity of Data Reviewed Radiology: ordered.  Risk Prescription drug management.   OBJECTIVE: She appears well, vital signs are normal. There is swelling and tenderness over the lateral malleolus and the medial malleolus.  The fifth metatarsal is not tender. The ankle joint is intact without excessive opening on stressing.  Independently interpreted x-ray: Questionable subtle hairline fracture of the lateral malleoli.  However, the radiologist read is negative for fracture.  Patient able to ambulate/bear weight.    Differential diagnosis considered includes ankle fracture, avulsion fracture, ankle sprain.  ASSESSMENT: Ankle  sprain and contusion  PLAN: rest the injured area as much as practical, apply ice packs, elevate the injured limb, splint dispensed and applied, crutches dispensed, referral to Orthopedics for this injury, note for work See orders for this visit as documented in the electronic medical record.   Final Clinical Impression(s) / ED Diagnoses Final diagnoses:  Acute right ankle pain    Rx / DC Orders ED Discharge Orders          Ordered    ibuprofen (ADVIL) 600 MG tablet  Every 6 hours PRN        10/02/22 2323              Derwood Kaplan, MD 10/02/22 2332

## 2023-02-21 ENCOUNTER — Emergency Department (HOSPITAL_BASED_OUTPATIENT_CLINIC_OR_DEPARTMENT_OTHER)
Admission: EM | Admit: 2023-02-21 | Discharge: 2023-02-21 | Disposition: A | Discharge status: Home / Self Care | Payer: BC Managed Care – PPO

## 2023-02-21 ENCOUNTER — Encounter (HOSPITAL_BASED_OUTPATIENT_CLINIC_OR_DEPARTMENT_OTHER): Payer: Self-pay

## 2023-02-21 DIAGNOSIS — J029 Acute pharyngitis, unspecified: Secondary | ICD-10-CM | POA: Diagnosis not present

## 2023-02-21 DIAGNOSIS — Z91040 Latex allergy status: Secondary | ICD-10-CM | POA: Insufficient documentation

## 2023-02-21 DIAGNOSIS — Z7984 Long term (current) use of oral hypoglycemic drugs: Secondary | ICD-10-CM | POA: Diagnosis not present

## 2023-02-21 DIAGNOSIS — I10 Essential (primary) hypertension: Secondary | ICD-10-CM | POA: Insufficient documentation

## 2023-02-21 DIAGNOSIS — H6691 Otitis media, unspecified, right ear: Secondary | ICD-10-CM | POA: Diagnosis not present

## 2023-02-21 DIAGNOSIS — E119 Type 2 diabetes mellitus without complications: Secondary | ICD-10-CM | POA: Insufficient documentation

## 2023-02-21 DIAGNOSIS — H9201 Otalgia, right ear: Secondary | ICD-10-CM | POA: Diagnosis present

## 2023-02-21 DIAGNOSIS — Z79899 Other long term (current) drug therapy: Secondary | ICD-10-CM | POA: Insufficient documentation

## 2023-02-21 MED ORDER — NAPROXEN 500 MG PO TABS: 500.0000 mg | ORAL_TABLET | Freq: Two times a day (BID) | ORAL | 0 refills | Status: AC

## 2023-02-21 MED ORDER — AMOXICILLIN-POT CLAVULANATE 875-125 MG PO TABS
1.0000 | ORAL_TABLET | Freq: Once | ORAL | Status: AC
  Administered 2023-02-21: 1 via ORAL
  Filled 2023-02-21: qty 1

## 2023-02-21 MED ORDER — KETOROLAC TROMETHAMINE 15 MG/ML IJ SOLN
15.0000 mg | Freq: Once | INTRAMUSCULAR | Status: AC
  Administered 2023-02-21: 15 mg via INTRAMUSCULAR
  Filled 2023-02-21: qty 1

## 2023-02-21 MED ORDER — AMOXICILLIN-POT CLAVULANATE 875-125 MG PO TABS: 1.0000 | ORAL_TABLET | Freq: Two times a day (BID) | ORAL | 0 refills | Status: AC

## 2023-02-21 NOTE — ED Notes (Signed)
ED Provider at bedside. 

## 2023-02-21 NOTE — ED Triage Notes (Signed)
Pt reports right ear and neck pain since Saturday.

## 2023-02-21 NOTE — Discharge Instructions (Addendum)
It does not look like you have an ear infection.  Please take the antibiotic as prescribed and follow-up with your doctor.  Return to the ER for worsening symptoms.

## 2023-02-21 NOTE — ED Provider Notes (Signed)
[place] [facility]ERGENCY DEPARTMENT AT [facility] Provider Note   CSN: [id number] Arrival date & time: 02/21/23  0809     History  Chief Complaint  Patient presents with   Ear Pain    Jillian Brown is a 52 y.o. female.  52 year old female with past medical history of diabetes and hypertension presenting to the emergency department today with right ear pain.  Patient states she is also been having some right neck pain.  This been going now for the past few days.  She states that she does have a sore throat on the right as well.  She denies any difficulty breathing or swallowing.  She denies any fevers.  She denies any neck stiffness.  She denies any focal weakness, numbness, or tingling.  She came to the ER today due to these ongoing symptoms.  Denies any cough with this.  She does report some congestion as well as some postnasal drip.        Home Medications Prior to Admission medications   Medication Sig Start Date End Date Taking? Authorizing Provider  amoxicillin-clavulanate (AUGMENTIN) 875-125 MG tablet Take 1 tablet by mouth every 12 (twelve) hours. 02/21/23  Yes Durwin Glaze, MD  atorvastatin (LIPITOR) 40 MG tablet Take 40 mg by mouth daily. 01/17/22  Yes [provider]  fluticasone (FLONASE) 50 MCG/ACT nasal spray Place 2 sprays into both nostrils daily. 01/16/22  Yes [provider]  ibuprofen (ADVIL) 600 MG tablet Take 1 tablet (600 mg total) by mouth every 6 (six) hours as needed. 10/02/22  Yes Derwood Kaplan, MD  losartan-hydrochlorothiazide (HYZAAR) 50-12.5 MG tablet Take 1 tablet by mouth daily. 01/16/22  Yes [provider]  montelukast (SINGULAIR) 10 MG tablet Take 10 mg by mouth at bedtime. 01/16/22  Yes [provider]  naproxen (NAPROSYN) 500 MG tablet Take 1 tablet (500 mg total) by mouth 2 (two) times daily. 02/21/23  Yes Durwin Glaze, MD  OZEMPIC, 0.25 OR 0.5 MG/DOSE, 2 MG/3ML SOPN Inject 0.5 mg into the skin once  a week. 05/21/22  Yes [provider]  tamoxifen (NOLVADEX) 10 MG tablet Take 10 mg by mouth 2 (two) times daily.   Yes [provider]  albuterol (PROVENTIL HFA;VENTOLIN HFA) 108 (90 BASE) MCG/ACT inhaler Inhale 2 puffs into the lungs every 6 (six) hours as needed. For shortness of breath and wheezing    [provider]  Budesonide 90 MCG/ACT inhaler Inhale 2 puffs into the lungs 2 (two) times daily. 06/14/22   Cathlyn Parsons, NP  cephALEXin (KEFLEX) 500 MG capsule Take 1 capsule (500 mg total) by mouth 4 (four) times daily. 05/03/22   Terrilee Files, MD  metFORMIN (GLUCOPHAGE-XR) 500 MG 24 hr tablet Take 500 mg by mouth 2 (two) times daily with a meal. 01/16/22   [provider]  Multiple Vitamin (MULTI-VITAMINS) TABS Take 1 tablet by mouth daily.    [provider]  naproxen (NAPROSYN) 375 MG tablet Take 1 tablet (375 mg total) by mouth 2 (two) times daily. 09/20/15   Arthor Captain, PA-C  oxyCODONE-acetaminophen (PERCOCET) 5-325 MG tablet Take 1 tablet by mouth every 6 (six) hours as needed for severe pain. 09/20/15   Arthor Captain, PA-C  phenazopyridine (PYRIDIUM) 200 MG tablet Take 1 tablet (200 mg total) by mouth 3 (three) times daily. 05/03/22   Terrilee Files, MD  Spacer/Aero-Holding Deretha Emory DEVI 1 each by Does not apply route as needed. 06/14/22   Cathlyn Parsons, NP  Allergies    Ultram [tramadol hcl], Iodinated contrast media, and Latex    Review of Systems   Review of Systems  HENT:  Positive for ear pain.   Musculoskeletal:  Positive for neck pain.  All other systems reviewed and are negative.   Physical Exam Updated Vital Signs BP 120/89 (BP Location: Right Arm)   Pulse 81   Temp 97.8 F (36.6 C) (Oral)   Resp 16   Ht 5\' 6"  (1.676 m)   Wt 102.1 kg   LMP  (LMP Unknown)   SpO2 100%   BMI 36.32 kg/m  Physical Exam Vitals and nursing note reviewed.   Gen: NAD Eyes: PERRL, EOMI HEENT: no oropharyngeal swelling  noted, tonsils are surgically absent, the patient does have purulent effusion on the right TM with some surrounding erythema, left TM normal appearance Neck: trachea midline, no meningismus Resp: clear to auscultation bilaterally Card: RRR, no murmurs, rubs, or gallops Abd: nontender, nondistended Extremities: no calf tenderness, no edema Vascular: 2+ radial pulses bilaterally, 2+ DP pulses bilaterally Skin: no rashes Psyc: acting appropriately   ED Results / Procedures / Treatments   Labs (all labs ordered are listed, but only abnormal results are displayed) Labs Reviewed - No data to display  EKG None  Radiology No results found.  Procedures Procedures    Medications Ordered in ED Medications  amoxicillin-clavulanate (AUGMENTIN) 875-125 MG per tablet 1 tablet (has no administration in time range)  ketorolac (TORADOL) 15 MG/ML injection 15 mg (has no administration in time range)    ED Course/ Medical Decision Making/ A&P                                 Medical Decision Making 52 year old female with past medical history of diabetes and hypertension presenting to the emergency department today with symptoms consistent with right otitis media.  The patient's vital signs are stable.  She does not have any findings on exam consistent with meningitis at this time.  She is well-appearing overall.  I do not appreciate any significant swelling in the posterior oropharynx.  She is denying difficulty swallowing so suspicion for deep space soft tissue infection is low at this time.  I will treat the patient with Augmentin.  She will be discharged with return precautions.  Risk Prescription drug management.           Final Clinical Impression(s) / ED Diagnoses Final diagnoses:  Right otitis media, unspecified otitis media type    Rx / DC Orders ED Discharge Orders          Ordered    naproxen (NAPROSYN) 500 MG tablet  2 times daily        02/21/23 0839     amoxicillin-clavulanate (AUGMENTIN) 875-125 MG tablet  Every 12 hours        02/21/23 0839              Durwin Glaze, MD 02/21/23 [phone]

## 2023-08-30 ENCOUNTER — Emergency Department (HOSPITAL_COMMUNITY)

## 2023-08-30 ENCOUNTER — Emergency Department (HOSPITAL_COMMUNITY): Admission: EM | Admit: 2023-08-30 | Discharge: 2023-08-30 | Disposition: A | Discharge status: Home / Self Care

## 2023-08-30 ENCOUNTER — Other Ambulatory Visit: Payer: Self-pay

## 2023-08-30 DIAGNOSIS — Z7984 Long term (current) use of oral hypoglycemic drugs: Secondary | ICD-10-CM | POA: Insufficient documentation

## 2023-08-30 DIAGNOSIS — K5900 Constipation, unspecified: Secondary | ICD-10-CM | POA: Diagnosis not present

## 2023-08-30 DIAGNOSIS — I10 Essential (primary) hypertension: Secondary | ICD-10-CM | POA: Insufficient documentation

## 2023-08-30 DIAGNOSIS — J45909 Unspecified asthma, uncomplicated: Secondary | ICD-10-CM | POA: Insufficient documentation

## 2023-08-30 DIAGNOSIS — E119 Type 2 diabetes mellitus without complications: Secondary | ICD-10-CM | POA: Insufficient documentation

## 2023-08-30 DIAGNOSIS — R109 Unspecified abdominal pain: Secondary | ICD-10-CM

## 2023-08-30 DIAGNOSIS — Z79899 Other long term (current) drug therapy: Secondary | ICD-10-CM | POA: Insufficient documentation

## 2023-08-30 DIAGNOSIS — Z91040 Latex allergy status: Secondary | ICD-10-CM | POA: Diagnosis not present

## 2023-08-30 LAB — URINALYSIS, MICROSCOPIC (REFLEX)
RBC / HPF: NONE SEEN RBC/hpf (ref 0–5)
WBC, UA: NONE SEEN WBC/hpf (ref 0–5)

## 2023-08-30 LAB — URINALYSIS, ROUTINE W REFLEX MICROSCOPIC
Bilirubin Urine: NEGATIVE
Glucose, UA: >=500 mg/dL — AB
Hgb urine dipstick: NEGATIVE
Ketones, ur: NEGATIVE mg/dL
Leukocytes,Ua: NEGATIVE
Nitrite: NEGATIVE
Protein, ur: NEGATIVE mg/dL
Specific Gravity, Urine: 1.01 (ref 1.005–1.030)
pH: 5.5 (ref 5.0–8.0)

## 2023-08-30 LAB — BASIC METABOLIC PANEL WITH GFR
Anion gap: 8 (ref 5–15)
BUN: 8 mg/dL (ref 6–20)
CO2: 23 mmol/L (ref 22–32)
Calcium: 9.4 mg/dL (ref 8.9–10.3)
Chloride: 107 mmol/L (ref 98–111)
Creatinine, Ser: 0.69 mg/dL (ref 0.44–1.00)
GFR, Estimated: >60 mL/min (ref >60)
Glucose, Bld: 93 mg/dL (ref 70–99)
Potassium: 4 mmol/L (ref 3.5–5.1)
Sodium: 138 mmol/L (ref 135–145)

## 2023-08-30 LAB — CBC
HCT: 38.9 % (ref 36.0–46.0)
Hemoglobin: 12.8 g/dL (ref 12.0–15.0)
MCH: 27.9 pg (ref 26.0–34.0)
MCHC: 32.9 g/dL (ref 30.0–36.0)
MCV: 84.7 fL (ref 80.0–100.0)
Platelets: 243 10*3/uL (ref 150–400)
RBC: 4.59 MIL/uL (ref 3.87–5.11)
RDW: 14.3 % (ref 11.5–15.5)
WBC: 5.6 10*3/uL (ref 4.0–10.5)
nRBC: 0 % (ref 0.0–0.2)

## 2023-08-30 MED ORDER — KETOROLAC TROMETHAMINE 15 MG/ML IJ SOLN
15.0000 mg | Freq: Once | INTRAMUSCULAR | Status: AC
  Administered 2023-08-30: 15 mg via INTRAMUSCULAR
  Filled 2023-08-30: qty 1

## 2023-08-30 MED ORDER — LIDOCAINE 5 % EX PTCH
1.0000 | MEDICATED_PATCH | CUTANEOUS | Status: DC
  Administered 2023-08-30: 1 via TRANSDERMAL
  Filled 2023-08-30: qty 1

## 2023-08-30 NOTE — ED Provider Notes (Signed)
  EMERGENCY DEPARTMENT AT [facility] Provider Note   CSN: [id number] Arrival date & time: 08/30/23  4098     History  Chief Complaint  Patient presents with   Flank Pain    Jillian Brown is a 53 y.o. female.  Patient with history of asthma, diabetes, Grave's disease, hypertension, kidney stones, IBS presents today with complaints of right flank pain. She states that same began yesterday and has been persistent since. She was able to sleep last night but when she woke up this morning her pain  Denies history of similar pain previously. No history of abdominal surgeries. Does endorse some nausea without vomiting and did have some diarrhea yesterday which she attributed to her IBS. She denies any fevers or chills, no urinary symptoms. No recent heavy lifting or overuse. Pain is not worsened with movement.    The history is provided by the patient. No language interpreter was used.  Flank Pain       Home Medications Prior to Admission medications   Medication Sig Start Date End Date Taking? Authorizing Provider  albuterol  (PROVENTIL  HFA;VENTOLIN  HFA) 108 (90 BASE) MCG/ACT inhaler Inhale 2 puffs into the lungs every 6 (six) hours as needed. For shortness of breath and wheezing    [provider]  amoxicillin -clavulanate (AUGMENTIN ) 875-125 MG tablet Take 1 tablet by mouth every 12 (twelve) hours. 02/21/23   Carin Charleston, MD  atorvastatin (LIPITOR) 40 MG tablet Take 40 mg by mouth daily. 01/17/22   [provider]  Budesonide  90 MCG/ACT inhaler Inhale 2 puffs into the lungs 2 (two) times daily. 06/14/22   Blinda Burger, NP  cephALEXin  (KEFLEX ) 500 MG capsule Take 1 capsule (500 mg total) by mouth 4 (four) times daily. 05/03/22   Butler, Michael C, MD  fluticasone [facility]) 50 MCG/ACT nasal spray Place 2 sprays into both nostrils daily. 01/16/22   [provider]  ibuprofen  (ADVIL ) 600 MG tablet Take 1 tablet (600 mg total) by mouth every 6  (six) hours as needed. 10/02/22   Deatra Face, MD  losartan-hydrochlorothiazide (HYZAAR) 50-12.5 MG tablet Take 1 tablet by mouth daily. 01/16/22   [provider]  metFORMIN (GLUCOPHAGE-XR) 500 MG 24 hr tablet Take 500 mg by mouth 2 (two) times daily with a meal. 01/16/22   [provider]  montelukast (SINGULAIR) 10 MG tablet Take 10 mg by mouth at bedtime. 01/16/22   [provider]  Multiple Vitamin (MULTI-VITAMINS) TABS Take 1 tablet by mouth daily.    [provider]  naproxen  (NAPROSYN ) 375 MG tablet Take 1 tablet (375 mg total) by mouth 2 (two) times daily. 09/20/15   Beckles, Abigail, PA-C  naproxen  (NAPROSYN ) 500 MG tablet Take 1 tablet (500 mg total) by mouth 2 (two) times daily. 02/21/23   Carin Charleston, MD  oxyCODONE -acetaminophen  (PERCOCET) 5-325 MG tablet Take 1 tablet by mouth every 6 (six) hours as needed for severe pain. 09/20/15   Ackerman, Abigail, PA-C  OZEMPIC, 0.25 OR 0.5 MG/DOSE, 2 MG/3ML SOPN Inject 0.5 mg into the skin once a week. 05/21/22   [provider]  phenazopyridine  (PYRIDIUM ) 200 MG tablet Take 1 tablet (200 mg total) by mouth 3 (three) times daily. 05/03/22   Tonya Fredrickson, MD  Spacer/Aero-Holding Idelle Majors DEVI 1 each by Does not apply route as needed. 06/14/22   Blinda Burger, NP  tamoxifen (NOLVADEX) 10 MG tablet Take 10 mg by mouth 2 (two) times daily.    [provider]  Allergies    Ultram [tramadol hcl], Iodinated contrast media, and Latex    Review of Systems   Review of Systems  Genitourinary:  Positive for flank pain.  All other systems reviewed and are negative.   Physical Exam Updated Vital Signs BP (!) 146/84 (BP Location: Left Arm)   Pulse 76   Temp (!) 97.5 F (36.4 C)   Resp 18   Ht 5\' 6"  (1.676 m)   Wt 96.6 kg   LMP  (LMP Unknown)   SpO2 100%   BMI 34.38 kg/m  Physical Exam Vitals and nursing note reviewed.  Constitutional:      General: She is not in acute distress.     Appearance: Normal appearance. She is normal weight. She is not ill-appearing, toxic-appearing or diaphoretic.  HENT:     Head: Normocephalic and atraumatic.  Cardiovascular:     Rate and Rhythm: Normal rate.  Pulmonary:     Effort: Pulmonary effort is normal. No respiratory distress.  Abdominal:     General: Abdomen is flat.     Palpations: Abdomen is soft.     Tenderness: There is no right CVA tenderness.     Comments: Right side tenderness. No rebound or guarding. No overlying skin changes.   Musculoskeletal:        General: Normal range of motion.     Cervical back: Normal range of motion.  Skin:    General: Skin is warm and dry.     Findings: No rash.  Neurological:     General: No focal deficit present.     Mental Status: She is alert.  Psychiatric:        Mood and Affect: Mood normal.        Behavior: Behavior normal.     ED Results / Procedures / Treatments   Labs (all labs ordered are listed, but only abnormal results are displayed) Labs Reviewed  URINALYSIS, ROUTINE W REFLEX MICROSCOPIC - Abnormal; Notable for the following components:      Result Value   Glucose, UA >=500 (*)    All other components within normal limits  URINALYSIS, MICROSCOPIC (REFLEX) - Abnormal; Notable for the following components:   Bacteria, UA RARE (*)    All other components within normal limits  BASIC METABOLIC PANEL WITH GFR  CBC    EKG None  Radiology CT Renal Stone Study Result Date: 08/30/2023 CLINICAL DATA:  Abdominal and flank pain, kidney stone suspected EXAM: CT ABDOMEN AND PELVIS WITHOUT CONTRAST TECHNIQUE: Multidetector CT imaging of the abdomen and pelvis was performed following the standard protocol without IV contrast. RADIATION DOSE REDUCTION: This exam was performed according to the departmental dose-optimization program which includes automated exposure control, adjustment of the mA and/or kV according to patient size and/or use of iterative reconstruction technique.  COMPARISON:  May 03, 2022 FINDINGS: Lower chest: No infiltrates or consolidations, no pleural effusions Hepatobiliary: Liver normal size no masses no biliary dilatation. Gallbladder unremarkable. No gallstones. Pancreas: Pancreas normal size. No masses calcifications or inflammatory changes. Spleen: Spleen normal size.  No masses. Adrenals/Urinary Tract: Adrenal glands are normal size. Follow-up recommended. Kidneys are normal. No masses calcifications or hydronephrosis Stomach/Bowel: No small or large bowel obstruction or inflammatory changes. Moderate amount of residual fecal material throughout the colon without obstruction or constipation. Vascular/Lymphatic: No significant vascular findings are present. No enlarged abdominal or pelvic lymph nodes. Reproductive: .  No masses. Bladder unremarkable. Other: Small bilateral inguinal hernias right larger than left containing fat only Musculoskeletal:  Visualized portion of the thoracolumbar spine and pelvic structures grossly unremarkable without evidence of fracture bony abnormalities or soft tissue masses. IMPRESSION: *No acute findings in the abdomen or pelvis. *Moderate amount of residual fecal material throughout the colon without obstruction or constipation. *Small bilateral inguinal hernias right larger than left containing fat only. Electronically Signed   By: Fredrich Jefferson M.D.   On: 08/30/2023 13:17    Procedures Procedures    Medications Ordered in ED Medications  lidocaine (LIDODERM) 5 % 1 patch (1 patch Transdermal Patch Applied 08/30/23 1317)  ketorolac  (TORADOL ) 15 MG/ML injection 15 mg (15 mg Intramuscular Given 08/30/23 1316)    ED Course/ Medical Decision Making/ A&P                                 Medical Decision Making Amount and/or Complexity of Data Reviewed Labs: ordered. Radiology: ordered.  Risk Prescription drug management.   This patient is a 53 y.o. female who presents to the ED for concern of right side pain,  this involves an extensive number of treatment options, and is a complaint that carries with it a high risk of complications and morbidity. The emergent differential diagnosis prior to evaluation includes, but is not limited to,  AAA, renal vascular thrombosis, mesenteric ischemia, pyelonephritis, nephrolithiasis, cystitis, biliary colic, pancreatitis, PUD, appendicitis, diverticulitis, bowel obstruction, Ectopic Pregnancy, PID/TOA, Ovarian cyst, Ovarian torsion, MSK   This is not an exhaustive differential.   Past Medical History / Co-morbidities / Social History:  has a past medical history of Arthritis, Asthma, Diabetes mellitus without complication (HCC), Grave's disease, Hypertension, Kidney stone, and Trigeminal neuralgia.  Additional history: Chart reviewed.  Physical Exam: Physical exam performed. The pertinent findings include: right side TTP without overlying skin changes, rebound, or guarding  Lab Tests: I ordered, and personally interpreted labs.  The pertinent results include:  no acute laboratory abnormalities. UA without hematuria, noninfectious   Imaging Studies: I ordered imaging studies including CT renal. I independently visualized and interpreted imaging which showed   *No acute findings in the abdomen or pelvis. *Moderate amount of residual fecal material throughout the colon without obstruction or constipation. *Small bilateral inguinal hernias right larger than left containing fat only.  I agree with the radiologist interpretation.   Medications: I ordered medication including toradol , lidocaine patch  for pain. Reevaluation of the patient after these medicines showed that the patient improved. I have reviewed the patients home medicines and have made adjustments as needed.   Disposition: After consideration of the diagnostic results and the patients response to treatment, I feel that emergency department workup does not suggest an emergent condition requiring  admission or immediate intervention beyond what has been performed at this time. The plan is: d/c.  Patient's workup is benign.  Does have some signs of constipation.  Patient does note that she has IBS and has struggled with constipation in the past.  On my review of her CT scan, it does look like she has some stool burden on the right lower side where her pain is.  Could be contributing.  Discussed same with patient is understanding and in agreement with this.  Recommend MiraLAX cleanout, close outpatient follow-up, and return precautions. Instructions given for same. Evaluation and diagnostic testing in the emergency department does not suggest an emergent condition requiring admission or immediate intervention beyond what has been performed at this time.  Plan for discharge with close PCP  follow-up.  Patient is understanding and amenable with plan, educated on red flag symptoms that would prompt immediate return.  Patient discharged in stable condition.  Final Clinical Impression(s) / ED Diagnoses Final diagnoses:  Flank pain  Constipation, unspecified constipation type    Rx / DC Orders ED Discharge Orders     None     An After Visit Summary was printed and given to the patient.     Sherra Dk, PA-C 08/30/23 1438    Rolinda Climes, DO 08/30/23 [phone]

## 2023-08-30 NOTE — ED Triage Notes (Addendum)
 Pt. Stated, I've had right side pain since last night. I went o sleep and still having pain. Ive took Tylenol  and no help. Pt takes Ozempic

## 2023-08-30 NOTE — Discharge Instructions (Signed)
 As we discussed, your workup in the ER today was reassuring for acute findings.  Laboratory evaluation and CT imaging did not reveal any emergent cause of your symptoms.  It does look like you have some stool in your colon specifically on the right side where your pain is.  This could be contributing to your pain.   You may do a Miralax bowel cleanout for relief which includes 8 capfulls of Miralax in Gatorade. Drink this in 1 hour on a day where you dont have plans and expect significant bowel movements throughout the day ending in liquid stools. You may continue forward with 1 capfull of Miralax twice a day if difficulties with bowel movements continue.   Please call your PCP to schedule close follow-up appointment.  Return if development of any new or worsening symptoms.

## 2023-08-30 NOTE — ED Notes (Signed)
 Pt transported to CT ?

## 2023-10-16 ENCOUNTER — Other Ambulatory Visit: Payer: Self-pay

## 2023-10-16 ENCOUNTER — Emergency Department (HOSPITAL_BASED_OUTPATIENT_CLINIC_OR_DEPARTMENT_OTHER)
Admission: EM | Admit: 2023-10-16 | Discharge: 2023-10-16 | Disposition: A | Discharge status: Home / Self Care | Source: Ambulatory Visit | Attending: Emergency Medicine | Admitting: Emergency Medicine

## 2023-10-16 DIAGNOSIS — Z91040 Latex allergy status: Secondary | ICD-10-CM | POA: Insufficient documentation

## 2023-10-16 DIAGNOSIS — R21 Rash and other nonspecific skin eruption: Secondary | ICD-10-CM | POA: Insufficient documentation

## 2023-10-16 DIAGNOSIS — L299 Pruritus, unspecified: Secondary | ICD-10-CM | POA: Diagnosis present

## 2023-10-16 MED ORDER — DEXAMETHASONE 4 MG PO TABS
10.0000 mg | ORAL_TABLET | Freq: Once | ORAL | Status: AC
  Administered 2023-10-16: 10 mg via ORAL
  Filled 2023-10-16: qty 3

## 2023-10-16 NOTE — Discharge Instructions (Signed)
 I have given you a steroid to try and help you.  Your hands to me seem very dry on the palm side.  I would have you try your soap that you use to something that is allergy and perfume free.  I would also have you try to use a lotion that is also allergy and perfume free.  Please follow-up with your family doctor in the office.  Please return for rapid spreading difficulty breathing or swallowing or if you develop nausea vomiting or diarrhea.

## 2023-10-16 NOTE — ED Triage Notes (Signed)
 Started having itching to bilateral hands since Friday. Been taking benadryl , using hydrocortisone cream.  States had hernia surgery on 7/7 at [facility].

## 2023-10-16 NOTE — ED Provider Notes (Signed)
 [place] [facility] Provider Note   CSN: [id number] Arrival date & time: 10/16/23  9146     Patient presents with: Pruritis   Keyon Winnick is a 53 y.o. female.   53 yo F with a chief complaint of itchy hands.  Been going on for about 3 to 4 days.  Has tried Benadryl  and hydrocortisone but without improvement.  She denies any other obvious symptoms.  Denies difficulty breathing denies difficulty swallowing denies nausea vomiting or diarrhea denies feeling she might pass out.  She denies any recent change to soaps or detergents.        Prior to Admission medications   Medication Sig Start Date End Date Taking? Authorizing Provider  albuterol  (PROVENTIL  HFA;VENTOLIN  HFA) 108 (90 BASE) MCG/ACT inhaler Inhale 2 puffs into the lungs every 6 (six) hours as needed. For shortness of breath and wheezing    [provider]  amoxicillin -clavulanate (AUGMENTIN ) 875-125 MG tablet Take 1 tablet by mouth every 12 (twelve) hours. 02/21/23   Ula Prentice SAUNDERS, MD  atorvastatin (LIPITOR) 40 MG tablet Take 40 mg by mouth daily. 01/17/22   [provider]  Budesonide  90 MCG/ACT inhaler Inhale 2 puffs into the lungs 2 (two) times daily. 06/14/22   Richad Jon HERO, NP  cephALEXin  (KEFLEX ) 500 MG capsule Take 1 capsule (500 mg total) by mouth 4 (four) times daily. 05/03/22   Butler, Michael C, MD  fluticasone [facility]) 50 MCG/ACT nasal spray Place 2 sprays into both nostrils daily. 01/16/22   [provider]  ibuprofen  (ADVIL ) 600 MG tablet Take 1 tablet (600 mg total) by mouth every 6 (six) hours as needed. 10/02/22   Charlyn Sora, MD  losartan-hydrochlorothiazide (HYZAAR) 50-12.5 MG tablet Take 1 tablet by mouth daily. 01/16/22   [provider]  metFORMIN (GLUCOPHAGE-XR) 500 MG 24 hr tablet Take 500 mg by mouth 2 (two) times daily with a meal. 01/16/22   [provider]  montelukast (SINGULAIR) 10 MG tablet Take 10 mg by mouth  at bedtime. 01/16/22   [provider]  Multiple Vitamin (MULTI-VITAMINS) TABS Take 1 tablet by mouth daily.    [provider]  naproxen  (NAPROSYN ) 375 MG tablet Take 1 tablet (375 mg total) by mouth 2 (two) times daily. 09/20/15   Zuercher, Abigail, PA-C  naproxen  (NAPROSYN ) 500 MG tablet Take 1 tablet (500 mg total) by mouth 2 (two) times daily. 02/21/23   Ula Prentice SAUNDERS, MD  oxyCODONE -acetaminophen  (PERCOCET) 5-325 MG tablet Take 1 tablet by mouth every 6 (six) hours as needed for severe pain. 09/20/15   Faddis, Abigail, PA-C  OZEMPIC, 0.25 OR 0.5 MG/DOSE, 2 MG/3ML SOPN Inject 0.5 mg into the skin once a week. 05/21/22   [provider]  phenazopyridine  (PYRIDIUM ) 200 MG tablet Take 1 tablet (200 mg total) by mouth 3 (three) times daily. 05/03/22   Towana Ozell BROCKS, MD  Spacer/Aero-Holding Raguel DEVI 1 each by Does not apply route as needed. 06/14/22   Richad Jon HERO, NP  tamoxifen (NOLVADEX) 10 MG tablet Take 10 mg by mouth 2 (two) times daily.    [provider]    Allergies: Tramadol hcl, Iodine, Metformin, Iodinated contrast media, Latex, and Shellfish allergy    Review of Systems  Updated Vital Signs BP (!) 150/104 (BP Location: Right Arm)   Pulse 89   Temp (!) 97.5 F (36.4 C) (Oral)   Resp 18   LMP  (LMP Unknown)   SpO2 98%  Physical Exam Vitals and nursing note reviewed.  Constitutional:      General: She is not in acute distress.    Appearance: She is well-developed. She is not diaphoretic.  HENT:     Head: Normocephalic and atraumatic.  Eyes:     Pupils: Pupils are equal, round, and reactive to light.  Cardiovascular:     Rate and Rhythm: Normal rate and regular rhythm.     Heart sounds: No murmur heard.    No friction rub. No gallop.  Pulmonary:     Effort: Pulmonary effort is normal.     Breath sounds: No wheezing or rales.  Abdominal:     General: There is no distension.     Palpations: Abdomen is soft.     Tenderness: There  is no abdominal tenderness.  Musculoskeletal:        General: No tenderness.     Cervical back: Normal range of motion and neck supple.  Skin:    General: Skin is warm and dry.     Comments: Palms of the hands are very dry bilaterally.  No obvious erythema.  No obvious rash.  Neurological:     Mental Status: She is alert and oriented to person, place, and time.  Psychiatric:        Behavior: Behavior normal.     (all labs ordered are listed, but only abnormal results are displayed) Labs Reviewed - No data to display  EKG: None  Radiology: No results found.   Procedures   Medications Ordered in the ED  dexamethasone  (DECADRON ) tablet 10 mg (has no administration in time range)                                    Medical Decision Making Risk Prescription drug management.   53 yo F with a chief complaint of itchiness to bilateral hands.  This is only on the palmar side.  Going on for a few days.  She denies contact with any obvious chemical or any new soap or detergent.  The palms of her hands are very dry on exam.  Encouraged trying to lubricate them.  Will have her try to change her soaps and lotions at home.  The patient did wonder if this was may be due to her Graves' disease.  Tells me that she had some vague symptoms when she was initially diagnosed.  I did discuss thyroid studies done from this facility are a send out.  I did offer to perform them here,  though cautioned that it would take some time.   She will follow-up with her primary care or endocrinologist for this.  She has no obvious systemic symptoms.  Will trial a single dose of Decadron .  PCP follow-up.  9:12 AM:  I have discussed the diagnosis/risks/treatment options with the patient.  Evaluation and diagnostic testing in the emergency department does not suggest an emergent condition requiring admission or immediate intervention beyond what has been performed at this time.  They will follow up with PCP,  endo. We also discussed returning to the ED immediately if new or worsening sx occur. We discussed the sx which are most concerning (e.g., sudden worsening pain, fever, inability to tolerate by mouth, rapid spreading, sob, syncope, n/v) that necessitate immediate return. Medications administered to the patient during their visit and any new prescriptions provided to the patient are listed below.  Medications given during this visit  Medications  dexamethasone  (DECADRON ) tablet 10 mg (has no administration in time range)     The patient appears reasonably screen and/or stabilized for discharge and I doubt any other medical condition or other [facility] requiring further screening, evaluation, or treatment in the ED at this time prior to discharge.       Final diagnoses:  Rash of hand    ED Discharge Orders     None          Emil Share, [place] 10/16/23 [phone]
